# Patient Record
Sex: Male | Born: 1970 | Race: White | Hispanic: No | Smoking: Current every day smoker
Health system: Southern US, Community
[De-identification: ages and names within clinical notes are randomized; demographics above are authoritative.]

## PROBLEM LIST (undated history)

## (undated) DIAGNOSIS — Z765 Malingerer [conscious simulation]: Secondary | ICD-10-CM

## (undated) DIAGNOSIS — I10 Essential (primary) hypertension: Secondary | ICD-10-CM

## (undated) DIAGNOSIS — I428 Other cardiomyopathies: Secondary | ICD-10-CM

## (undated) DIAGNOSIS — F1010 Alcohol abuse, uncomplicated: Secondary | ICD-10-CM

## (undated) DIAGNOSIS — N200 Calculus of kidney: Secondary | ICD-10-CM

## (undated) DIAGNOSIS — G8929 Other chronic pain: Secondary | ICD-10-CM

## (undated) HISTORY — PX: CARDIAC DEFIBRILLATOR PLACEMENT: SHX171

## (undated) HISTORY — DX: Other cardiomyopathies: I42.8

## (undated) HISTORY — PX: HIP SURGERY: SHX245

## (undated) HISTORY — PX: PACEMAKER REVISION: SHX5999

## (undated) HISTORY — PX: CARDIAC PACEMAKER PLACEMENT: SHX583

## (undated) HISTORY — PX: SHOULDER SURGERY: SHX246

---

## 1999-12-07 ENCOUNTER — Emergency Department (HOSPITAL_COMMUNITY): Admission: EM | Admit: 1999-12-07 | Discharge: 1999-12-07 | Payer: Self-pay | Admitting: Emergency Medicine

## 2000-06-24 ENCOUNTER — Emergency Department (HOSPITAL_COMMUNITY): Admission: EM | Admit: 2000-06-24 | Discharge: 2000-06-24 | Payer: Self-pay | Admitting: Emergency Medicine

## 2000-06-24 ENCOUNTER — Encounter: Payer: Self-pay | Admitting: Emergency Medicine

## 2004-05-17 HISTORY — PX: OTHER SURGICAL HISTORY: SHX169

## 2005-05-17 HISTORY — PX: OTHER SURGICAL HISTORY: SHX169

## 2010-08-06 ENCOUNTER — Emergency Department (HOSPITAL_COMMUNITY)
Admission: EM | Admit: 2010-08-06 | Discharge: 2010-08-07 | Disposition: A | Discharge status: Home / Self Care | Payer: Medicare Other | Attending: Emergency Medicine | Admitting: Emergency Medicine

## 2010-08-06 DIAGNOSIS — F1210 Cannabis abuse, uncomplicated: Secondary | ICD-10-CM | POA: Insufficient documentation

## 2010-08-06 DIAGNOSIS — G8929 Other chronic pain: Secondary | ICD-10-CM | POA: Insufficient documentation

## 2010-08-06 DIAGNOSIS — F1010 Alcohol abuse, uncomplicated: Secondary | ICD-10-CM | POA: Insufficient documentation

## 2010-08-07 LAB — DIFFERENTIAL
Basophils Absolute: 0.1 10*3/uL (ref 0.0–0.1)
Basophils Relative: 1 % (ref 0–1)
Eosinophils Absolute: 0.5 10*3/uL (ref 0.0–0.7)
Eosinophils Relative: 4 % (ref 0–5)
Lymphocytes Relative: 18 % (ref 12–46)
Lymphs Abs: 2.2 10*3/uL (ref 0.7–4.0)
Monocytes Absolute: 0.7 10*3/uL (ref 0.1–1.0)
Monocytes Relative: 6 % (ref 3–12)
Neutro Abs: 8.8 10*3/uL — ABNORMAL HIGH (ref 1.7–7.7)
Neutrophils Relative %: 72 % (ref 43–77)

## 2010-08-07 LAB — CBC
HCT: 42.6 % (ref 39.0–52.0)
Hemoglobin: 13.9 g/dL (ref 13.0–17.0)
MCH: 29.6 pg (ref 26.0–34.0)
MCHC: 32.6 g/dL (ref 30.0–36.0)
MCV: 90.8 fL (ref 78.0–100.0)
Platelets: 325 10*3/uL (ref 150–400)
RBC: 4.69 MIL/uL (ref 4.22–5.81)
RDW: 12.9 % (ref 11.5–15.5)
WBC: 12.2 10*3/uL — ABNORMAL HIGH (ref 4.0–10.5)

## 2010-08-07 LAB — RAPID URINE DRUG SCREEN, HOSP PERFORMED
Amphetamines: NOT DETECTED
Barbiturates: NOT DETECTED
Benzodiazepines: POSITIVE — AB
Cocaine: NOT DETECTED
Opiates: NOT DETECTED
Tetrahydrocannabinol: POSITIVE — AB

## 2010-08-07 LAB — URINALYSIS, ROUTINE W REFLEX MICROSCOPIC
Bilirubin Urine: NEGATIVE
Glucose, UA: NEGATIVE mg/dL
Hgb urine dipstick: NEGATIVE
Ketones, ur: NEGATIVE mg/dL
Nitrite: NEGATIVE
Protein, ur: NEGATIVE mg/dL
Specific Gravity, Urine: 1.023 (ref 1.005–1.030)
Urobilinogen, UA: 0.2 mg/dL (ref 0.0–1.0)
pH: 5.5 (ref 5.0–8.0)

## 2010-08-07 LAB — BASIC METABOLIC PANEL
GFR calc Af Amer: >60 mL/min (ref >60)
GFR calc non Af Amer: >60 mL/min (ref >60)
Potassium: 3.9 mEq/L (ref 3.5–5.1)
Sodium: 139 mEq/L (ref 135–145)

## 2010-08-07 LAB — BASIC METABOLIC PANEL WITH GFR
BUN: 10 mg/dL (ref 6–23)
CO2: 28 meq/L (ref 19–32)
Calcium: 9.5 mg/dL (ref 8.4–10.5)
Chloride: 105 meq/L (ref 96–112)
Creatinine, Ser: 1.03 mg/dL (ref 0.4–1.5)
Glucose, Bld: 93 mg/dL (ref 70–99)

## 2010-08-07 LAB — ETHANOL: Alcohol, Ethyl (B): 6 mg/dL (ref 0–10)

## 2010-08-13 ENCOUNTER — Ambulatory Visit (INDEPENDENT_AMBULATORY_CARE_PROVIDER_SITE_OTHER): Payer: Medicare Other | Admitting: Family Medicine

## 2010-08-13 ENCOUNTER — Telehealth: Payer: Self-pay | Admitting: Family Medicine

## 2010-08-13 ENCOUNTER — Encounter: Payer: Self-pay | Admitting: Family Medicine

## 2010-08-13 ENCOUNTER — Ambulatory Visit
Admission: RE | Admit: 2010-08-13 | Discharge: 2010-08-13 | Disposition: A | Discharge status: Home / Self Care | Payer: Medicare Other | Source: Ambulatory Visit | Attending: Family Medicine | Admitting: Family Medicine

## 2010-08-13 DIAGNOSIS — Z95810 Presence of automatic (implantable) cardiac defibrillator: Secondary | ICD-10-CM

## 2010-08-13 DIAGNOSIS — M25579 Pain in unspecified ankle and joints of unspecified foot: Secondary | ICD-10-CM | POA: Insufficient documentation

## 2010-08-13 DIAGNOSIS — S82891A Other fracture of right lower leg, initial encounter for closed fracture: Secondary | ICD-10-CM

## 2010-08-13 DIAGNOSIS — I10 Essential (primary) hypertension: Secondary | ICD-10-CM

## 2010-08-13 DIAGNOSIS — E785 Hyperlipidemia, unspecified: Secondary | ICD-10-CM

## 2010-08-13 DIAGNOSIS — Z950 Presence of cardiac pacemaker: Secondary | ICD-10-CM

## 2010-08-13 DIAGNOSIS — S82899A Other fracture of unspecified lower leg, initial encounter for closed fracture: Secondary | ICD-10-CM

## 2010-08-13 DIAGNOSIS — F419 Anxiety disorder, unspecified: Secondary | ICD-10-CM

## 2010-08-13 DIAGNOSIS — I429 Cardiomyopathy, unspecified: Secondary | ICD-10-CM | POA: Insufficient documentation

## 2010-08-13 MED ORDER — CLONAZEPAM 1 MG PO TABS: 1.0000 mg | ORAL_TABLET | Freq: Two times a day (BID) | ORAL | Status: AC | PRN

## 2010-08-13 MED ORDER — OXYCODONE-ACETAMINOPHEN 5-325 MG PO TABS: 1.0000 | ORAL_TABLET | ORAL | Status: AC | PRN

## 2010-08-13 NOTE — Telephone Encounter (Signed)
Call pt: Does have an avulsion fracture so really does need to see ortho. I will schedule him downstairs with ortho.

## 2010-08-13 NOTE — Assessment & Plan Note (Signed)
BP is at goal. Continue current regimen. Due for labs CMP and lipids in sept.

## 2010-08-13 NOTE — Assessment & Plan Note (Signed)
Xray today to eval fracture. Continue wearing boot until we get the results. I did refill his hydrocodone for 30 more tabs. iF STILL needing pain meds after that will need to see ortho.

## 2010-08-13 NOTE — Progress Notes (Signed)
  Subjective:    Patient ID: Terry Mcconnell, male    DOB: 1971-05-14, 40 y.o.   MRN: [id number]  HPI Was walking and rolled right ankle and on March 16th.  Fracture distal left tibia. Has been wearing a boot. Due for repeat xray.  No chronic steroids.  Does eat some dairy but not a lot. He is overweight.    Still really sore to walk on.  Most of the pain oon thie ouside of his foot and on op of the foot,  Use to go to the [place].  He says he gets labs every 6 months and just had labs in march.  Has not seen ortho.    Review of Systems  Constitutional: Negative for fever, diaphoresis and unexpected weight change.  HENT: Negative for hearing loss, rhinorrhea, sneezing and tinnitus.   Eyes: Negative for visual disturbance.  Respiratory: Negative for cough and wheezing.   Cardiovascular: Negative for chest pain and palpitations.  Gastrointestinal: Negative for nausea, vomiting, diarrhea and blood in stool.  Genitourinary: Negative for dysuria and discharge.  Musculoskeletal: Negative for myalgias and arthralgias.  Skin: Negative for rash.  Neurological: Negative for headaches.  Hematological: Negative for adenopathy.  Psychiatric/Behavioral: Negative for sleep disturbance and dysphoric mood. The patient is not nervous/anxious.        Objective:   Physical Exam  Constitutional: He appears well-developed and well-nourished.  HENT:  Head: Normocephalic and atraumatic.  Cardiovascular: Normal rate, regular rhythm and normal heart sounds.        Intact distal pulses on the right.   Pulmonary/Chest: Effort normal and breath sounds normal.  Musculoskeletal:       Slightly dec ROM over the ankle.  Sig swelling on the outside of the ankle over the lateral malleolus.  stength 5/5 with flexion/extension, inversion, eversion.  No bruising.   Skin:       The left lower leg has evidence of a well healed skin graft. No active drainage.      BP 135/81  Pulse 69  Ht 5\' 8"  (1.727 m)  Wt 252 lb (114.306  kg)  BMI 38.32 kg/m2        Assessment & Plan:

## 2010-08-13 NOTE — Patient Instructions (Addendum)
I did refill your oxycodone/APAP 5/325 for 30 tabs.  Need to wean off of this for your ankle pain over the next couple of weeks We will call you with the xray results.   Keep wearing your boot until you hear from [place].

## 2010-08-14 NOTE — Telephone Encounter (Signed)
Pt notifed; if possible pt wants to be scheduled after 9 am

## 2010-08-25 ENCOUNTER — Ambulatory Visit: Payer: Medicare Other | Admitting: Family Medicine

## 2010-09-09 ENCOUNTER — Telehealth: Payer: Self-pay | Admitting: *Deleted

## 2010-09-09 DIAGNOSIS — M25579 Pain in unspecified ankle and joints of unspecified foot: Secondary | ICD-10-CM

## 2010-09-09 NOTE — Telephone Encounter (Signed)
Pt called and states he wanted a referral to [facility]

## 2010-09-09 NOTE — Telephone Encounter (Signed)
REferral entered.

## 2010-09-10 ENCOUNTER — Ambulatory Visit (INDEPENDENT_AMBULATORY_CARE_PROVIDER_SITE_OTHER): Payer: Medicare Other | Admitting: Family Medicine

## 2010-09-10 ENCOUNTER — Encounter: Payer: Self-pay | Admitting: Family Medicine

## 2010-09-10 DIAGNOSIS — M25579 Pain in unspecified ankle and joints of unspecified foot: Secondary | ICD-10-CM

## 2010-09-10 DIAGNOSIS — L821 Other seborrheic keratosis: Secondary | ICD-10-CM | POA: Insufficient documentation

## 2010-09-10 DIAGNOSIS — J309 Allergic rhinitis, unspecified: Secondary | ICD-10-CM

## 2010-09-10 MED ORDER — HYDROCODONE-ACETAMINOPHEN 7.5-500 MG PO TABS: 1.0000 | ORAL_TABLET | Freq: Four times a day (QID) | ORAL | Status: AC | PRN

## 2010-09-10 MED ORDER — ESCITALOPRAM OXALATE 20 MG PO TABS: 20.0000 mg | ORAL_TABLET | Freq: Every day | ORAL | Status: DC

## 2010-09-10 MED ORDER — BUPROPION HCL ER (XL) 150 MG PO TB24: 150.0000 mg | ORAL_TABLET | Freq: Every day | ORAL | Status: DC

## 2010-09-10 MED ORDER — KETOCONAZOLE 2 % EX SHAM: MEDICATED_SHAMPOO | CUTANEOUS | Status: AC

## 2010-09-10 NOTE — Patient Instructions (Signed)
Try to schedule an appointment with orthopedics for the next 14 days. Let me know if your scalp is not better in 3-4 weeks.

## 2010-09-10 NOTE — Progress Notes (Signed)
  Subjective:    Patient ID: Terry Mcconnell, male    DOB: 05-Sep-1970, 40 y.o.   MRN: [id number]  HPI Says he is doing well. We made his pain referral for yesterday. He needs refills his lexapro, wellbutrin and klonopin. He was in jail for 2 weeks for money he owed so that is why he missed his appt.    Scalp and beard is red, dry, and ithcy.  Has been using several types of dandruff shampoos and they are not helping   He has season allergies and he is still congested.  He had a rx product for nasal allergies but it caused nose bleeds.   He was taking oxycodone for his leg.  He did see ortho who recommended.  He would like ot have 14 days worth until he sees ortho again.  He felt that Dr. due to didn't really evaluate his leg. He plans on seeing an orthopedist. He says he would like that she stepped down to Vicodin 7.5 mg. He said he did notice a little bit itching in the creases of his forearms, when he took this previously but he is not sure if this is a true allergic reaction.    Review of Systems     Objective:   Physical Exam  Constitutional: He appears well-developed and well-nourished.  HENT:  Head: Normocephalic and atraumatic.  Right Ear: External ear normal.  Left Ear: External ear normal.  Nose: Nose normal.  Mouth/Throat: Oropharynx is clear and moist.  Eyes: Conjunctivae and EOM are normal. Pupils are equal, round, and reactive to light.  Neck: Neck supple. No thyromegaly present.  Cardiovascular: Normal rate, regular rhythm and normal heart sounds.   Pulmonary/Chest: Effort normal and breath sounds normal.  Lymphadenopathy:    He has no cervical adenopathy.  Skin: Skin is warm and dry.       Erythematous dry scaly scalp. He also has some similar rash underneath his beard.  Psychiatric: He has a normal mood and affect.          Assessment & Plan:  Allergic rhinitis-he can certainly try Allegra or Zyrtec. This might work a little but better than the Claritin. We can  consider a different nasal steroid or a nasal antihistamine if his symptoms persist.  Seborrheic dermatitis-will treat with antifungal ketoconazole shampoo. He has not had significant improvement in 3-4 weeks can consider topical steroid treatment.  Right ankle pain chronic-I did give him 14 days' worth of his medication. It is up to him that a followup appointment with the orthopedist. We have set in motion a referral for pain management.

## 2010-09-15 ENCOUNTER — Telehealth: Payer: Self-pay | Admitting: Family Medicine

## 2010-09-15 ENCOUNTER — Telehealth: Payer: Self-pay | Admitting: *Deleted

## 2010-09-15 NOTE — Telephone Encounter (Signed)
Phone note previously started therefore created in error.

## 2010-09-15 NOTE — Telephone Encounter (Addendum)
Please check with Terry Mcconnell on the Pain referal. The referral was entered the day before his Office Visit.  Also I need a reason for his GI referral. I didn't see anything in the last note and since he is under 50 and I need a reason

## 2010-09-15 NOTE — Telephone Encounter (Signed)
Pt called and wanted to know if referral had been put in for [facility] because he called and they did not know anything about it and also wants a referral to a GI doc

## 2010-09-15 NOTE — Telephone Encounter (Signed)
Called pt and he went to [facility] each time he had "stomach" issues. Advised pt that he needs appt to discuss stomach issues

## 2010-09-16 ENCOUNTER — Other Ambulatory Visit: Payer: Self-pay | Admitting: Family Medicine

## 2010-09-16 MED ORDER — CLONAZEPAM 0.5 MG PO TABS: 0.5000 mg | ORAL_TABLET | Freq: Two times a day (BID) | ORAL | Status: DC | PRN

## 2010-09-17 ENCOUNTER — Telehealth: Payer: Self-pay | Admitting: Family Medicine

## 2010-09-17 NOTE — Telephone Encounter (Signed)
Patient called and left a mess. on our voice mail stating that Clonazepam had been changed from 1mg  to .5 mg and he would like to know why and is very upset about this. Patient called at 2:35 in the morning to leave a message about this. Please advise him of why this change has happened.

## 2010-09-17 NOTE — Telephone Encounter (Signed)
Call pt: Can you change this to to 1mg . Dose. I am not sure. I just refilled what was in there.

## 2010-09-17 NOTE — Telephone Encounter (Signed)
Jay pharmacist at [facility] notified to change pts Clonazepam RX to 1mg  instead of .5mg .  Sent wrong dose in error.  Told pharmacist to straighten out since pt already picked up script .5mg  # 60.  Had one refill on script, therefore gave verbal for 1mg  #60/1rf for the next time pt will need to get refill.  Pt informed and an apology was given Jarvis Newcomer, LPN [facility]

## 2010-09-29 ENCOUNTER — Ambulatory Visit
Admission: RE | Admit: 2010-09-29 | Discharge: 2010-09-29 | Disposition: A | Discharge status: Home / Self Care | Payer: Medicare Other | Source: Ambulatory Visit | Attending: Family Medicine | Admitting: Family Medicine

## 2010-09-29 ENCOUNTER — Encounter: Payer: Self-pay | Admitting: Family Medicine

## 2010-09-29 ENCOUNTER — Ambulatory Visit (INDEPENDENT_AMBULATORY_CARE_PROVIDER_SITE_OTHER): Payer: Medicare Other | Admitting: Family Medicine

## 2010-09-29 DIAGNOSIS — S82891A Other fracture of right lower leg, initial encounter for closed fracture: Secondary | ICD-10-CM

## 2010-09-29 DIAGNOSIS — M25579 Pain in unspecified ankle and joints of unspecified foot: Secondary | ICD-10-CM

## 2010-09-29 DIAGNOSIS — G8929 Other chronic pain: Secondary | ICD-10-CM

## 2010-09-29 DIAGNOSIS — R1110 Vomiting, unspecified: Secondary | ICD-10-CM

## 2010-09-29 NOTE — Progress Notes (Signed)
Subjective:    Patient ID: Terry Mcconnell, male    DOB: 1970-12-29, 40 y.o.   MRN: [id number]  HPI Was seen at [facility] for vomiting and then hydrated and sent home.  Then started getting nauseated again took nausea pills. Went  To to ED again and kept overnight for 3 days  He said he had e coli. Then  Went back a few days and told it may be his gallbladder. They did an [place] and Abdominal CT in early March (6th-13th). D/C home and did well for about 1.5 then started vomiting again. He was told the GB was swollen but no stones. He is doing a bone density test today.  He prefers [facility] for GI.  He still has a couple of zofran left at home. He did note he takes a PPI regularly. I did update his medication list.  He would also like a refill on his pain medication. He saw the orthopedist again since I last saw him. They did not give him a prescription for pain medication. He said he was unable to get in at the office we initially made the pain referral. He was denied. In the past he had seen Dr. [name] Likes the approximately 2-3 years ago. Around that time he decided he wanted to come off of his medications and so discontinued them and never return to Dr. [name] Likes his office. He says he never actually broke a pain contract and was on a good relationship with him.   BP 129/72  Pulse 69  Ht 5\' 8"  (1.727 m)  Wt 248 lb (112.492 kg)  BMI 37.71 kg/m2    Allergies  Allergen Reactions  . Vicodin (Hydrocodone-Acetaminophen)     Past Medical History  Diagnosis Date  . Left ventricular noncompaction cardiomyopathy     Past Surgical History  Procedure Date  . Left knee surgery 2007  . Skin grafts 2006    x 2   . Cardiac defibrillator placement     x 2   . Cardiac pacemaker placement     x 2     History   Social History  . Marital Status: Married    Spouse Name: Jill Side    Number of Children: no  . Years of Education: N/A   Occupational History  . veteran    Social History Main Topics  . Smoking  status: Former Smoker    Types: Cigarettes  . Smokeless tobacco: Former Neurosurgeon    Types: Snuff    Quit date: 06/15/2003  . Alcohol Use: Yes  . Drug Use: No  . Sexually Active: Yes   Other Topics Concern  . Not on file   Social History Narrative   No regular exercise.     Family History  Problem Relation Age of Onset  . Bipolar disorder Brother   . Diabetes type II Father     Current outpatient prescriptions:buPROPion (WELLBUTRIN XL) 150 MG 24 hr tablet, Take 1 tablet (150 mg total) by mouth daily., Disp: 30 tablet, Rfl: 4;  carvedilol (COREG) 25 MG tablet, Take 25 mg by mouth 2 (two) times daily with a meal.  , Disp: , Rfl: ;  clonazePAM (KLONOPIN) 1 MG tablet, Take 1 mg by mouth 2 (two) times daily as needed.  , Disp: , Rfl:  escitalopram (LEXAPRO) 20 MG tablet, Take 1 tablet (20 mg total) by mouth daily., Disp: 30 tablet, Rfl: 4;  ketoconazole (NIZORAL) 2 % shampoo, Apply topically 2 (two) times a week. Apply to scalp and  leave on for 5 minutes twice a week., Disp: 240 mL, Rfl: 1;  lisinopril (PRINIVIL,ZESTRIL) 5 MG tablet, Take 5 mg by mouth daily.  , Disp: , Rfl: ;  Multiple Vitamin (MULTIVITAMIN) tablet, Take 1 tablet by mouth daily.  , Disp: , Rfl:  nitroGLYCERIN (NITROSTAT) 0.4 MG SL tablet, Place 0.4 mg under the tongue every 5 (five) minutes as needed.  , Disp: , Rfl: ;  pantoprazole (PROTONIX) 40 MG tablet, Take 40 mg by mouth daily.  , Disp: , Rfl: ;  Probiotic Product (PROBIOTIC ACIDOPHILUS PO), Take by mouth.  , Disp: , Rfl: ;  simvastatin (ZOCOR) 20 MG tablet, Take 20 mg by mouth at bedtime.  , Disp: , Rfl:  DISCONTD: clonazePAM (KLONOPIN) 0.5 MG tablet, Take 1 tablet (0.5 mg total) by mouth 2 (two) times daily as needed., Disp: 60 tablet, Rfl: 1;  DISCONTD: clonazePAM (KLONOPIN) 0.5 MG tablet, Take 1 mg by mouth 2 (two) times daily as needed. , Disp: , Rfl:   Review of Systems     Objective:   Physical Exam  Constitutional: He appears well-developed and well-nourished.    HENT:  Head: Normocephalic and atraumatic.          Assessment & Plan:  Abdominal pain and vomiting-he is asymptomatic today. I did not do an exam. At this point, need to get records and we can refer him to GI. It sounds like they were unclear about what exactly was causing his vomiting. Certainly it sounded like the gallbladder may have been suspicious. Typically with cholecystitis I recommend referral to a general surgeon but this may not be his exact diagnosis. We will schedule him at [facility]. He has Zofran at home he can use when necessary and he can call our office if he needs a refill.

## 2010-09-29 NOTE — Patient Instructions (Signed)
We will call you with the GI referral and for Dr. [name] Likes.

## 2010-09-29 NOTE — Assessment & Plan Note (Signed)
For his chronic pain I recommend that we try to schedule him with Dr. [name] Likes. The left that clinic with a good relationship and did not break a pain contract I think this would be a great place to start. Plus, they should have all records on him. I discussed with him that I would not refill his pain medications and that if his orthopedist feels that he needs medications until his appointment with pain management that they can fill these for him.

## 2010-10-01 ENCOUNTER — Telehealth: Payer: Self-pay | Admitting: Family Medicine

## 2010-10-01 NOTE — Telephone Encounter (Signed)
Left message on pt vm. 

## 2010-10-01 NOTE — Telephone Encounter (Signed)
Call pt: Normal bone density

## 2010-12-04 ENCOUNTER — Telehealth: Payer: Self-pay | Admitting: Family Medicine

## 2010-12-04 NOTE — Telephone Encounter (Signed)
Pt requesting refill of allegra.  When I look in pt chart and on med list there isn't a allergy med prescribed.  Please advise as I know a lot of the allergy meds are OTC. Terry Newcomer, LPN [facility]

## 2010-12-06 MED ORDER — FEXOFENADINE HCL 180 MG PO TABS: 180.0000 mg | ORAL_TABLET | Freq: Every day | ORAL | Status: DC

## 2010-12-06 NOTE — Telephone Encounter (Signed)
Will send new rx but his insurance may not cover it since it is now OTC.

## 2010-12-07 ENCOUNTER — Other Ambulatory Visit: Payer: Self-pay | Admitting: *Deleted

## 2010-12-07 MED ORDER — CLONAZEPAM 1 MG PO TABS: 1.0000 mg | ORAL_TABLET | Freq: Two times a day (BID) | ORAL | Status: DC | PRN

## 2010-12-17 ENCOUNTER — Ambulatory Visit: Payer: Medicare Other | Admitting: Family Medicine

## 2010-12-17 DIAGNOSIS — Z0289 Encounter for other administrative examinations: Secondary | ICD-10-CM

## 2010-12-22 ENCOUNTER — Ambulatory Visit: Payer: Medicare Other | Admitting: Family Medicine

## 2010-12-22 DIAGNOSIS — Z0289 Encounter for other administrative examinations: Secondary | ICD-10-CM

## 2011-01-12 ENCOUNTER — Other Ambulatory Visit: Payer: Self-pay | Admitting: *Deleted

## 2011-01-12 MED ORDER — CLONAZEPAM 1 MG PO TABS: 1.0000 mg | ORAL_TABLET | Freq: Two times a day (BID) | ORAL | Status: DC | PRN

## 2011-01-22 ENCOUNTER — Encounter: Payer: Self-pay | Admitting: Family Medicine

## 2011-01-22 ENCOUNTER — Ambulatory Visit (INDEPENDENT_AMBULATORY_CARE_PROVIDER_SITE_OTHER): Payer: Medicare Other | Admitting: Family Medicine

## 2011-01-22 VITALS — BP 133/87 | HR 87 | Wt 235.0 lb

## 2011-01-22 DIAGNOSIS — F411 Generalized anxiety disorder: Secondary | ICD-10-CM

## 2011-01-22 DIAGNOSIS — F419 Anxiety disorder, unspecified: Secondary | ICD-10-CM

## 2011-01-22 DIAGNOSIS — Z23 Encounter for immunization: Secondary | ICD-10-CM

## 2011-01-22 DIAGNOSIS — F329 Major depressive disorder, single episode, unspecified: Secondary | ICD-10-CM

## 2011-01-22 MED ORDER — FLUOXETINE HCL 20 MG PO TABS: ORAL_TABLET | ORAL | Status: DC

## 2011-01-22 MED ORDER — DIAZEPAM 2 MG PO TABS: 2.0000 mg | ORAL_TABLET | Freq: Two times a day (BID) | ORAL | Status: AC | PRN

## 2011-01-22 MED ORDER — OLANZAPINE 5 MG PO TBDP: 5.0000 mg | ORAL_TABLET | Freq: Every day | ORAL | Status: AC

## 2011-01-22 NOTE — Patient Instructions (Signed)
Decrease the lexapro to 1/2 tab daily for one week, then decrease to 1/2 every other day for 10 days then stop. Start fluoxetine in 7 days with 1/2 tab daily for one week and then increase to whole tab.  Welbutrin every other day for 2 weeks, then stop.  Can start the olanzapine 5 mg daily. Cn increase to 10mg  after one week if needed.

## 2011-01-22 NOTE — Progress Notes (Signed)
  Subjective:    Patient ID: Terry Mcconnell, male    DOB: 07-09-1970, 40 y.o.   MRN: [id number]  HPI Last few months has been getting oeverwhelmed and feels his anxiety is not well controlled. He is on lexapro and wellbutin and the klonopin. Not sleeping well at night.  Says will hear a noise and get very anxious. Has been more irritable.  Will start get heart racing and sweating.  Has called the [place] and has an appt on the 28th.  He was on symbyax at one time and says it worked really well. Unfortunately he only took it for a short period time because his insurance would no longer cover it. Sister has Bipolar d/o. He also feels down and depressed,  his wife is very supportive of him.   Review of Systems     Objective:   Physical Exam  Constitutional: He is oriented to person, place, and time. He appears well-developed and well-nourished.  Cardiovascular: Normal heart sounds.   Neurological: He is alert and oriented to person, place, and time.  Psychiatric: He has a normal mood and affect. His behavior is normal. Thought content normal.          Assessment & Plan:  Anxiety-GAD- 7 score of 19 today.  Clearly his anxiety is not well controlled. I do feel he also has an element of depression. Also listened to his history I think he actually could be bipolar. He made the comment that at one point when he was put on Prozac by itself he actually felt hyper. He also has a family history of bipolar disorder. Since he had tried Symbyax, I will go ahead and start him on fluoxetine and Risperdal. I wrote down a wean off of the Lexapro and Wellbutrin. He also specifically requested that I could change him to Valium instead of lorazepam. I also changed him to 2 mg twice a day. I'll see him back in one month. I did encourage him to keep his appointment at the [facility] as well so that they can further diagnosis. 25 minutes spent face-to-face in counseling and discussing therapy options.

## 2011-02-09 ENCOUNTER — Other Ambulatory Visit: Payer: Self-pay | Admitting: Family Medicine

## 2011-02-19 ENCOUNTER — Other Ambulatory Visit: Payer: Self-pay | Admitting: *Deleted

## 2011-02-19 MED ORDER — DIAZEPAM 2 MG PO TABS: 2.0000 mg | ORAL_TABLET | Freq: Two times a day (BID) | ORAL | Status: DC | PRN

## 2011-03-30 ENCOUNTER — Ambulatory Visit: Payer: Medicare Other | Admitting: Family Medicine

## 2011-03-30 DIAGNOSIS — Z0289 Encounter for other administrative examinations: Secondary | ICD-10-CM

## 2011-04-02 ENCOUNTER — Other Ambulatory Visit: Payer: Self-pay | Admitting: Family Medicine

## 2011-04-05 ENCOUNTER — Other Ambulatory Visit: Payer: Self-pay | Admitting: *Deleted

## 2011-04-05 MED ORDER — DIAZEPAM 2 MG PO TABS: 2.0000 mg | ORAL_TABLET | Freq: Two times a day (BID) | ORAL | Status: DC | PRN

## 2011-04-22 ENCOUNTER — Other Ambulatory Visit: Payer: Self-pay | Admitting: *Deleted

## 2011-04-22 MED ORDER — DIAZEPAM 2 MG PO TABS: 2.0000 mg | ORAL_TABLET | Freq: Two times a day (BID) | ORAL | Status: DC | PRN

## 2013-09-21 DIAGNOSIS — I110 Hypertensive heart disease with heart failure: Secondary | ICD-10-CM | POA: Diagnosis not present

## 2013-09-21 DIAGNOSIS — E7800 Pure hypercholesterolemia, unspecified: Secondary | ICD-10-CM | POA: Diagnosis not present

## 2013-09-21 DIAGNOSIS — I501 Left ventricular failure: Secondary | ICD-10-CM | POA: Diagnosis not present

## 2013-09-21 DIAGNOSIS — I509 Heart failure, unspecified: Secondary | ICD-10-CM | POA: Diagnosis not present

## 2013-09-25 DIAGNOSIS — M67919 Unspecified disorder of synovium and tendon, unspecified shoulder: Secondary | ICD-10-CM | POA: Diagnosis not present

## 2013-10-19 DIAGNOSIS — M1610 Unilateral primary osteoarthritis, unspecified hip: Secondary | ICD-10-CM | POA: Diagnosis not present

## 2013-10-19 DIAGNOSIS — M25559 Pain in unspecified hip: Secondary | ICD-10-CM | POA: Diagnosis not present

## 2013-10-19 DIAGNOSIS — M169 Osteoarthritis of hip, unspecified: Secondary | ICD-10-CM | POA: Diagnosis not present

## 2014-04-23 DIAGNOSIS — F419 Anxiety disorder, unspecified: Secondary | ICD-10-CM | POA: Diagnosis not present

## 2014-04-23 DIAGNOSIS — M25552 Pain in left hip: Secondary | ICD-10-CM | POA: Diagnosis not present

## 2014-04-23 DIAGNOSIS — M25512 Pain in left shoulder: Secondary | ICD-10-CM | POA: Diagnosis not present

## 2014-08-01 ENCOUNTER — Emergency Department (HOSPITAL_BASED_OUTPATIENT_CLINIC_OR_DEPARTMENT_OTHER)
Admission: EM | Admit: 2014-08-01 | Discharge: 2014-08-01 | Disposition: A | Discharge status: Home / Self Care | Payer: Commercial Managed Care - HMO | Attending: Emergency Medicine | Admitting: Emergency Medicine

## 2014-08-01 ENCOUNTER — Encounter (HOSPITAL_BASED_OUTPATIENT_CLINIC_OR_DEPARTMENT_OTHER): Payer: Self-pay | Admitting: *Deleted

## 2014-08-01 DIAGNOSIS — M549 Dorsalgia, unspecified: Secondary | ICD-10-CM | POA: Diagnosis present

## 2014-08-01 DIAGNOSIS — I10 Essential (primary) hypertension: Secondary | ICD-10-CM | POA: Diagnosis not present

## 2014-08-01 DIAGNOSIS — Z79899 Other long term (current) drug therapy: Secondary | ICD-10-CM | POA: Insufficient documentation

## 2014-08-01 DIAGNOSIS — M545 Low back pain, unspecified: Secondary | ICD-10-CM

## 2014-08-01 DIAGNOSIS — Z95810 Presence of automatic (implantable) cardiac defibrillator: Secondary | ICD-10-CM | POA: Diagnosis not present

## 2014-08-01 DIAGNOSIS — Z87891 Personal history of nicotine dependence: Secondary | ICD-10-CM | POA: Diagnosis not present

## 2014-08-01 HISTORY — DX: Essential (primary) hypertension: I10

## 2014-08-01 MED ORDER — NAPROXEN 500 MG PO TABS: 500.0000 mg | ORAL_TABLET | Freq: Two times a day (BID) | ORAL | Status: DC

## 2014-08-01 MED ORDER — CYCLOBENZAPRINE HCL 10 MG PO TABS
5.0000 mg | ORAL_TABLET | Freq: Once | ORAL | Status: AC
  Administered 2014-08-01: 5 mg via ORAL
  Filled 2014-08-01: qty 1

## 2014-08-01 MED ORDER — NAPROXEN 250 MG PO TABS
500.0000 mg | ORAL_TABLET | Freq: Once | ORAL | Status: AC
  Administered 2014-08-01: 500 mg via ORAL
  Filled 2014-08-01: qty 2

## 2014-08-01 MED ORDER — CYCLOBENZAPRINE HCL 10 MG PO TABS: 10.0000 mg | ORAL_TABLET | Freq: Two times a day (BID) | ORAL | Status: DC | PRN

## 2014-08-01 NOTE — ED Provider Notes (Signed)
CSN: [id number]     Arrival date & time 08/01/14  1813 History   First MD Initiated Contact with Patient 08/01/14 2007     Chief Complaint  Patient presents with  . Leg Pain  . Back Pain     (Consider location/radiation/quality/duration/timing/severity/associated sxs/prior Treatment) HPI   44 year old male with history of back pain and left leg surgery who presents for evaluation of back pain. Patient states he usually has pain to his left leg and left knee from prior surgery and has been taking hydrocodone 7.5 mg as needed for pain. He also endorsed intermittent low back pain for the past 6 months. For the past 2 days he has had worsening low back pain which he described as a sharp throbbing sensation, nonradiating, worsening with ambulation and improves when he leaned forward. He also noticed bilateral leg weakness with the back pain. States he breaks out into sweats when the pain is severe. He denies any specific injuries to his back but states when he was seen in the [employer] he has to carry 80 pounds backpack everywhere which he thinks may have injured his back. Report having an x-ray of his low back 3 months ago by PCP that does not show any acute finding. Patient also denies having fever, dysuria, hematuria, bowel or bladder incontinence, saddle anesthesia, or rash. He did attempt to follow-up with his primary care doctor but because his doctor is so busy he was recommended that he come to the ED for further evaluation. His next follow-up appointment is March 21. No history of IV drug use or active cancer.  Past Medical History  Diagnosis Date  . Left ventricular noncompaction cardiomyopathy   . Hypertension    Past Surgical History  Procedure Laterality Date  . Left knee surgery  2007  . Skin grafts  2006    x 2   . Cardiac defibrillator placement      x 2   . Cardiac pacemaker placement      x 2    Family History  Problem Relation Age of Onset  . Bipolar disorder Brother   .  Diabetes type II Father    History  Substance Use Topics  . Smoking status: Former Smoker    Types: Cigarettes  . Smokeless tobacco: Current User    Types: Snuff    Last Attempt to Quit: 06/15/2003  . Alcohol Use: Yes    Review of Systems  Gastrointestinal: Negative for abdominal pain.  Genitourinary: Negative for dysuria.  Musculoskeletal: Negative for back pain.  Skin: Negative for rash.  Neurological: Negative for numbness.  All other systems reviewed and are negative.     Allergies  Review of patient's allergies indicates no active allergies.  Home Medications   Prior to Admission medications   Medication Sig Start Date End Date Taking? Authorizing Provider  carvedilol (COREG) 25 MG tablet Take 25 mg by mouth 2 (two) times daily with a meal.      Historical Provider, MD  diazepam (VALIUM) 2 MG tablet Take 1 tablet (2 mg total) by mouth 2 (two) times daily as needed. 04/22/11   Agapito Games, MD  fexofenadine (ALLEGRA) 180 MG tablet Take 1 tablet (180 mg total) by mouth daily. 12/06/10 12/06/11  Agapito Games, MD  FLUoxetine (PROZAC) 20 MG tablet TAKE 1/2 TABLET BY MOUTH EVERY DAY FOR 1 WEEK THEN INCREASE TO 1 TABLET DAILY 02/09/11   Agapito Games, MD  lisinopril (PRINIVIL,ZESTRIL) 5 MG tablet Take 5 mg  by mouth daily.      Historical Provider, MD  Multiple Vitamin (MULTIVITAMIN) tablet Take 1 tablet by mouth daily.      Historical Provider, MD  nitroGLYCERIN (NITROSTAT) 0.4 MG SL tablet Place 0.4 mg under the tongue every 5 (five) minutes as needed.      Historical Provider, MD  OLANZapine zydis (ZYPREXA) 5 MG disintegrating tablet TAKE 1 TABLET BY MOUTH AT BEDTIME 04/02/11   Agapito Games, MD  pantoprazole (PROTONIX) 40 MG tablet Take 40 mg by mouth daily.      Historical Provider, MD  Probiotic Product (PROBIOTIC ACIDOPHILUS PO) Take by mouth.      Historical Provider, MD  simvastatin (ZOCOR) 20 MG tablet Take 20 mg by mouth at bedtime.       Historical Provider, MD   BP 117/71 mmHg  Pulse 81  Temp(Src) 98.5 F (36.9 C) (Oral)  Resp 16  Ht  (1.727 m)  Wt 215 lb (97.523 kg)  BMI 32.70 kg/m2  SpO2 98% Physical Exam  Constitutional: He appears well-developed and well-nourished. No distress.  HENT:  Head: Atraumatic.  Eyes: Conjunctivae are normal.  Neck: Normal range of motion. Neck supple.  Abdominal: There is no tenderness.  Musculoskeletal: He exhibits tenderness (tenderness to lumbar and paralumbar spinal muscle on palpation without overlying skin changes.).  5/5 strength to bilateral lower extremities with intact patellar deep tendon reflex, no foot drop, and normal dorsiflexion of great toe  Negative straight leg raise  Neurological: He is alert.  Sensation is intact throughout lower extremities. Patient able to ambulate with a mild hunch back.  Skin: No rash noted.  Large skin graft noted to left lower anterior tibial region with no evidence of infection and nontender to palpation.  Psychiatric: He has a normal mood and affect.    ED Course  Procedures (including critical care time)  Patient here with acute on chronic back pain. No red flags. Suspect spinal stenosis as symptoms improve when patient flexes his back. Low suspicion for cauda equina or sciatica. I have reviewed [place] controlled substance database which revealed that patient has received 90 hydrocodone on March 14 and 60 alprazolam on March 11. At this time I will only provide pain medication in the ED and will discharge with muscle relaxant and close follow-up.  Labs Review Labs Reviewed - No data to display  Imaging Review No results found.   EKG Interpretation None      MDM   Final diagnoses:  Midline low back pain without sciatica    BP 117/71 mmHg  Pulse 81  Temp(Src) 98.5 F (36.9 C) (Oral)  Resp 16  Ht  (1.727 m)  Wt 215 lb (97.523 kg)  BMI 32.70 kg/m2  SpO2 98%     Fayrene Helper, PA-C 08/01/14 2051  Rolan Bucco, MD 08/02/14 0028

## 2014-08-01 NOTE — ED Notes (Signed)
Pt reports left leg pain that radiates up to pt's lower/mid back pain x2 days, denies any mechanism of injury - pt w/ hx of back pain and left leg surgery including skin grafts and knee surgery - pt attempted to see his PCP however was unable to be seen and PCP advised pt to be evaluated in ED. Pt states he has taken x2 of his prescribed 7.5mg  hyrdrocodone w/o relief.

## 2014-08-01 NOTE — ED Notes (Signed)
PA at bedside.

## 2014-08-01 NOTE — Discharge Instructions (Signed)
Back Exercises These exercises may help you when beginning to rehabilitate your injury. Your symptoms may resolve with or without further involvement from your physician, physical therapist or athletic trainer. While completing these exercises, remember:   Restoring tissue flexibility helps normal motion to return to the joints. This allows healthier, less painful movement and activity.  An effective stretch should be held for at least 30 seconds.  A stretch should never be painful. You should only feel a gentle lengthening or release in the stretched tissue. STRETCH - Extension, Prone on Elbows   Lie on your stomach on the floor, a bed will be too soft. Place your palms about shoulder width apart and at the height of your head.  Place your elbows under your shoulders. If this is too painful, stack pillows under your chest.  Allow your body to relax so that your hips drop lower and make contact more completely with the floor.  Hold this position for __________ seconds.  Slowly return to lying flat on the floor. Repeat __________ times. Complete this exercise __________ times per day.  RANGE OF MOTION - Extension, Prone Press Ups   Lie on your stomach on the floor, a bed will be too soft. Place your palms about shoulder width apart and at the height of your head.  Keeping your back as relaxed as possible, slowly straighten your elbows while keeping your hips on the floor. You may adjust the placement of your hands to maximize your comfort. As you gain motion, your hands will come more underneath your shoulders.  Hold this position __________ seconds.  Slowly return to lying flat on the floor. Repeat __________ times. Complete this exercise __________ times per day.  RANGE OF MOTION- Quadruped, Neutral Spine   Assume a hands and knees position on a firm surface. Keep your hands under your shoulders and your knees under your hips. You may place padding under your knees for  comfort.  Drop your head and point your tail bone toward the ground below you. This will round out your low back like an angry cat. Hold this position for __________ seconds.  Slowly lift your head and release your tail bone so that your back sags into a large arch, like an old horse.  Hold this position for __________ seconds.  Repeat this until you feel limber in your low back.  Now, find your "sweet spot." This will be the most comfortable position somewhere between the two previous positions. This is your neutral spine. Once you have found this position, tense your stomach muscles to support your low back.  Hold this position for __________ seconds. Repeat __________ times. Complete this exercise __________ times per day.  STRETCH - Flexion, Single Knee to Chest   Lie on a firm bed or floor with both legs extended in front of you.  Keeping one leg in contact with the floor, bring your opposite knee to your chest. Hold your leg in place by either grabbing behind your thigh or at your knee.  Pull until you feel a gentle stretch in your low back. Hold __________ seconds.  Slowly release your grasp and repeat the exercise with the opposite side. Repeat __________ times. Complete this exercise __________ times per day.  STRETCH - Hamstrings, Standing  Stand or sit and extend your right / left leg, placing your foot on a chair or foot stool  Keeping a slight arch in your low back and your hips straight forward.  Lead with your chest and   lean forward at the waist until you feel a gentle stretch in the back of your right / left knee or thigh. (When done correctly, this exercise requires leaning only a small distance.)  Hold this position for __________ seconds. Repeat __________ times. Complete this stretch __________ times per day. STRENGTHENING - Deep Abdominals, Pelvic Tilt   Lie on a firm bed or floor. Keeping your legs in front of you, bend your knees so they are both pointed  toward the ceiling and your feet are flat on the floor.  Tense your lower abdominal muscles to press your low back into the floor. This motion will rotate your pelvis so that your tail bone is scooping upwards rather than pointing at your feet or into the floor.  With a gentle tension and even breathing, hold this position for __________ seconds. Repeat __________ times. Complete this exercise __________ times per day.  STRENGTHENING - Abdominals, Crunches   Lie on a firm bed or floor. Keeping your legs in front of you, bend your knees so they are both pointed toward the ceiling and your feet are flat on the floor. Cross your arms over your chest.  Slightly tip your chin down without bending your neck.  Tense your abdominals and slowly lift your trunk high enough to just clear your shoulder blades. Lifting higher can put excessive stress on the low back and does not further strengthen your abdominal muscles.  Control your return to the starting position. Repeat __________ times. Complete this exercise __________ times per day.  STRENGTHENING - Quadruped, Opposite UE/LE Lift   Assume a hands and knees position on a firm surface. Keep your hands under your shoulders and your knees under your hips. You may place padding under your knees for comfort.  Find your neutral spine and gently tense your abdominal muscles so that you can maintain this position. Your shoulders and hips should form a rectangle that is parallel with the floor and is not twisted.  Keeping your trunk steady, lift your right hand no higher than your shoulder and then your left leg no higher than your hip. Make sure you are not holding your breath. Hold this position __________ seconds.  Continuing to keep your abdominal muscles tense and your back steady, slowly return to your starting position. Repeat with the opposite arm and leg. Repeat __________ times. Complete this exercise __________ times per day. Document Released:  05/21/2005 Document Revised: 07/26/2011 Document Reviewed: 08/15/2008 [employer] Patient Information 2015 [employer]. This information is not intended to replace advice given to you by your health care provider. Make sure you discuss any questions you have with your health care provider.  

## 2015-01-16 DIAGNOSIS — R079 Chest pain, unspecified: Secondary | ICD-10-CM | POA: Diagnosis not present

## 2015-01-16 DIAGNOSIS — T849XXA Unspecified complication of internal orthopedic prosthetic device, implant and graft, initial encounter: Secondary | ICD-10-CM | POA: Diagnosis not present

## 2015-01-16 DIAGNOSIS — T82599A Other mechanical complication of unspecified cardiac and vascular devices and implants, initial encounter: Secondary | ICD-10-CM | POA: Diagnosis not present

## 2017-01-31 ENCOUNTER — Emergency Department (HOSPITAL_BASED_OUTPATIENT_CLINIC_OR_DEPARTMENT_OTHER): Payer: Medicare Other

## 2017-01-31 ENCOUNTER — Encounter (HOSPITAL_BASED_OUTPATIENT_CLINIC_OR_DEPARTMENT_OTHER): Payer: Self-pay | Admitting: *Deleted

## 2017-01-31 ENCOUNTER — Emergency Department (HOSPITAL_BASED_OUTPATIENT_CLINIC_OR_DEPARTMENT_OTHER)
Admission: EM | Admit: 2017-01-31 | Discharge: 2017-01-31 | Disposition: A | Discharge status: Home / Self Care | Payer: Medicare Other | Attending: Emergency Medicine | Admitting: Emergency Medicine

## 2017-01-31 DIAGNOSIS — Z79899 Other long term (current) drug therapy: Secondary | ICD-10-CM | POA: Diagnosis not present

## 2017-01-31 DIAGNOSIS — Z95810 Presence of automatic (implantable) cardiac defibrillator: Secondary | ICD-10-CM | POA: Insufficient documentation

## 2017-01-31 DIAGNOSIS — N201 Calculus of ureter: Secondary | ICD-10-CM | POA: Diagnosis not present

## 2017-01-31 DIAGNOSIS — I10 Essential (primary) hypertension: Secondary | ICD-10-CM | POA: Diagnosis not present

## 2017-01-31 DIAGNOSIS — F17290 Nicotine dependence, other tobacco product, uncomplicated: Secondary | ICD-10-CM | POA: Diagnosis not present

## 2017-01-31 DIAGNOSIS — R1032 Left lower quadrant pain: Secondary | ICD-10-CM | POA: Diagnosis present

## 2017-01-31 HISTORY — DX: Calculus of kidney: N20.0

## 2017-01-31 LAB — URINALYSIS, MICROSCOPIC (REFLEX): Bacteria, UA: NONE SEEN

## 2017-01-31 LAB — URINALYSIS, ROUTINE W REFLEX MICROSCOPIC
BILIRUBIN URINE: NEGATIVE
Glucose, UA: NEGATIVE mg/dL
KETONES UR: NEGATIVE mg/dL
Leukocytes, UA: NEGATIVE
NITRITE: NEGATIVE
Protein, ur: 30 mg/dL — AB
pH: 6 (ref 5.0–8.0)

## 2017-01-31 MED ORDER — HYDROMORPHONE HCL 4 MG PO TABS: 4.0000 mg | ORAL_TABLET | ORAL | 0 refills | Status: DC | PRN

## 2017-01-31 MED ORDER — TAMSULOSIN HCL 0.4 MG PO CAPS: ORAL_CAPSULE | ORAL | 0 refills | Status: DC

## 2017-01-31 MED ORDER — ONDANSETRON HCL 4 MG/2ML IJ SOLN
4.0000 mg | Freq: Once | INTRAMUSCULAR | Status: AC
  Administered 2017-01-31: 4 mg via INTRAVENOUS
  Filled 2017-01-31: qty 2

## 2017-01-31 MED ORDER — SODIUM CHLORIDE 0.9 % IV SOLN
Freq: Once | INTRAVENOUS | Status: AC
  Administered 2017-01-31: 04:00:00 via INTRAVENOUS

## 2017-01-31 MED ORDER — ONDANSETRON 8 MG PO TBDP: 8.0000 mg | ORAL_TABLET | Freq: Three times a day (TID) | ORAL | 0 refills | Status: DC | PRN

## 2017-01-31 MED ORDER — HYDROMORPHONE HCL 1 MG/ML IJ SOLN
1.0000 mg | Freq: Once | INTRAMUSCULAR | Status: AC
  Administered 2017-01-31: 1 mg via INTRAVENOUS
  Filled 2017-01-31: qty 1

## 2017-01-31 MED ORDER — TAMSULOSIN HCL 0.4 MG PO CAPS
0.4000 mg | ORAL_CAPSULE | Freq: Once | ORAL | Status: AC
  Administered 2017-01-31: 0.4 mg via ORAL
  Filled 2017-01-31: qty 1

## 2017-01-31 NOTE — ED Provider Notes (Addendum)
[facility] Provider Note: Lowella Dell, MD, FACEP  CSN: [id number] MRN: [id number] ARRIVAL: 01/31/17 at 0338 ROOM: [facility]02/[facility]   CHIEF COMPLAINT  Flank Pain   HISTORY OF PRESENT ILLNESS  01/31/17 3:45 AM Terry Mcconnell is a 46 y.o. male with a history of nephrolithiasis. He is here with left flank pain that began about an hour and a half ago. He describes the pain as severe and worse than previous kidney stone. The pain radiates to the left upper quadrant of his abdomen. It is worse with lying supine. There is been associated nausea but no vomiting. He denies dysuria or difficulty urinating but has noted his urine is darker than usual.  Consultation with the [facility] state controlled substances database reveals the patient has received 14 prescriptions for oxycodone and for prescriptions for tramadol in the past year, none since May.   Past Medical History:  Diagnosis Date  . Hypertension   . Kidney stones   . 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 CARDIAC DEFIBRILLATOR PLACEMENT     x 2   . CARDIAC PACEMAKER PLACEMENT     x 2   . left knee surgery  2007  . PACEMAKER REVISION    . skin grafts  2006   x 2     Family History  Problem Relation Age of Onset  . Bipolar disorder Brother   . Diabetes type II Father     Social History  Substance Use Topics  . Smoking status: Former Smoker    Types: Cigarettes  . Smokeless tobacco: Current User    Types: Snuff    Last attempt to quit: 06/15/2003  . Alcohol use No    Prior to Admission medications   Medication Sig Start Date End Date Taking? Authorizing Provider  carvedilol (COREG) 25 MG tablet Take 25 mg by mouth 2 (two) times daily with a meal.      [provider]  cyclobenzaprine (FLEXERIL) 10 MG tablet Take 1 tablet (10 mg total) by mouth 2 (two) times daily as needed for muscle spasms. 08/01/14   Fayrene Helper, PA-C    diazepam (VALIUM) 2 MG tablet Take 1 tablet (2 mg total) by mouth 2 (two) times daily as needed. 04/22/11   Agapito Games, MD  fexofenadine (ALLEGRA) 180 MG tablet Take 1 tablet (180 mg total) by mouth daily. 12/06/10 12/06/11  Agapito Games, MD  FLUoxetine (PROZAC) 20 MG tablet TAKE 1/2 TABLET BY MOUTH EVERY DAY FOR 1 WEEK THEN INCREASE TO 1 TABLET DAILY 02/09/11   Agapito Games, MD  lisinopril (PRINIVIL,ZESTRIL) 5 MG tablet Take 5 mg by mouth daily.      [provider]  Multiple Vitamin (MULTIVITAMIN) tablet Take 1 tablet by mouth daily.      [provider]  naproxen (NAPROSYN) 500 MG tablet Take 1 tablet (500 mg total) by mouth 2 (two) times daily. 08/01/14   Fayrene Helper, PA-C  nitroGLYCERIN (NITROSTAT) 0.4 MG SL tablet Place 0.4 mg under the tongue every 5 (five) minutes as needed.      [provider]  OLANZapine zydis (ZYPREXA) 5 MG disintegrating tablet TAKE 1 TABLET BY MOUTH AT BEDTIME 04/02/11   Agapito Games, MD  pantoprazole (PROTONIX) 40 MG tablet Take 40 mg by mouth daily.      [provider]  Probiotic Product (PROBIOTIC ACIDOPHILUS PO) Take by mouth.  [provider]  simvastatin (ZOCOR) 20 MG tablet Take 20 mg by mouth at bedtime.      [provider]    Allergies Patient has no known allergies.   REVIEW OF SYSTEMS  Negative except as noted here or in the History of Present Illness.   PHYSICAL EXAMINATION  Initial Vital Signs Blood pressure (!) 146/84, pulse (!) 50, temperature (!) 97.5 F (36.4 C), temperature source Oral, resp. rate 18, height 5\' 8"  (1.727 m), weight 91.6 kg (202 lb), SpO2 100 %.  Examination General: Well-developed, well-nourished male in no acute distress; appearance consistent with age of record HENT: normocephalic; atraumatic Eyes: pupils equal, round and reactive to light; extraocular muscles intact Neck: supple Heart: regular rate and rhythm;  bradycardia Lungs: clear to auscultation bilaterally Abdomen: soft; nondistended; nontender; no masses or hepatosplenomegaly; bowel sounds present GU: Left CVA tenderness Extremities: No deformity; full range of motion; pulses normal Neurologic: Awake, alert and oriented; motor function intact in all extremities and symmetric; no facial droop Skin: Warm and dry Psychiatric: Mildly agitated   RESULTS  Summary of this visit's results, reviewed by myself:   EKG Interpretation  Date/Time:    Ventricular Rate:    PR Interval:    QRS Duration:   QT Interval:    QTC Calculation:   R Axis:     Text Interpretation:        Laboratory Studies: Results for orders placed or performed during the hospital encounter of 01/31/17 (from the past 24 hour(s))  Urinalysis, Routine w reflex microscopic     Status: Abnormal   Collection Time: 01/31/17  5:45 AM  Result Value Ref Range   Color, Urine AMBER (A) YELLOW   APPearance CLOUDY (A) CLEAR   Specific Gravity, Urine >1.030 (H) 1.005 - 1.030   pH 6.0 5.0 - 8.0   Glucose, UA NEGATIVE NEGATIVE mg/dL   Hgb urine dipstick LARGE (A) NEGATIVE   Bilirubin Urine NEGATIVE NEGATIVE   Ketones, ur NEGATIVE NEGATIVE mg/dL   Protein, ur 30 (A) NEGATIVE mg/dL   Nitrite NEGATIVE NEGATIVE   Leukocytes, UA NEGATIVE NEGATIVE  Urinalysis, Microscopic (reflex)     Status: Abnormal   Collection Time: 01/31/17  5:45 AM  Result Value Ref Range   RBC / HPF TOO NUMEROUS TO COUNT 0 - 5 RBC/hpf   WBC, UA 0-5 0 - 5 WBC/hpf   Bacteria, UA NONE SEEN NONE SEEN   Squamous Epithelial / LPF 0-5 (A) NONE SEEN   Mucus PRESENT    Ca Oxalate Crys, UA PRESENT    Imaging Studies: Ct Renal Stone Study  Result Date: 01/31/2017 CLINICAL DATA:  LEFT flank pain for 1-1/2 hours. History of kidney stones and hypertension. EXAM: CT ABDOMEN AND PELVIS WITHOUT CONTRAST TECHNIQUE: Multidetector CT imaging of the abdomen and pelvis was performed following the standard protocol without  IV contrast. COMPARISON:  CT abdomen and pelvis November 14, 2015 FINDINGS: LOWER CHEST: Minimal dependent atelectasis. The visualized heart size is normal. No pericardial effusion. Streak artifact from pacer wires including within LEFT chest wall. HEPATOBILIARY: Normal. PANCREAS: Normal. SPLEEN: Normal. ADRENALS/URINARY TRACT: Kidneys are orthotopic, demonstrating normal size and morphology. Mild LEFT hydronephrosis to the level the ureteral pelvic junction where a 3 mm calculus is present ; LEFT interpolar stone on prior imaging study has likely migrated. Punctate LEFT interpolar nephrolithiasis. 3 mm RIGHT interpolar and 2 mm RIGHT upper pole nephrolithiasis. Limited assessment for renal masses on this nonenhanced examination. 3 cm unchanged exophytic LEFT lower pole  renal cyst. Urinary bladder is partially distended and unremarkable. Normal adrenal glands. STOMACH/BOWEL: The stomach, small and large bowel are normal in course and caliber without inflammatory changes, sensitivity decreased by lack of enteric contrast. Mild colonic diverticulosis. Punctate tip appendicolith without CT findings of acute appendicitis. VASCULAR/LYMPHATIC: Aortoiliac vessels are normal in course and caliber. Mild calcific atherosclerosis. No lymphadenopathy by CT size criteria. REPRODUCTIVE: Normal. OTHER: No intraperitoneal free fluid or free air. MUSCULOSKELETAL: Non-acute. Streak artifact from LEFT hip total arthroplasty. Small fat containing inguinal hernias. IMPRESSION: 3 mm LEFT ureteral pelvic junction calculus resulting in mild hydronephrosis. Residual bilateral nephrolithiasis measuring to 3 mm. Aortic Atherosclerosis (ICD10-I70.0). Electronically Signed   By: Awilda Metro M.D.   On: 01/31/2017 04:17    ED COURSE  Nursing notes and initial vitals signs, including pulse oximetry, reviewed.  Vitals:   01/31/17 0339 01/31/17 0400  BP: (!) 146/84   Pulse: (!) 50 62  Resp: 18   Temp: (!) 97.5 F (36.4 C)   TempSrc:  Oral   SpO2: 100% 100%  Weight: 91.6 kg (202 lb)   Height:  (1.727 m)     PROCEDURES    ED DIAGNOSES     ICD-10-CM   1. Ureterolithiasis N20.1        Dory Verdun, MD 01/31/17 [id number]    Paula Libra, MD 01/31/17 [phone]

## 2017-01-31 NOTE — ED Notes (Signed)
Pt feels better. States he would like to ambulate out. No complaints. Friend at bedside.

## 2017-01-31 NOTE — ED Notes (Signed)
Rates pain 4/10. Will medicate per order. Pt has a ride at bedside.

## 2017-01-31 NOTE — ED Notes (Signed)
Pt states no change in pain level. Still rates 9/10 MD aware and orders received.

## 2017-01-31 NOTE — ED Notes (Signed)
Returned from CT.

## 2017-01-31 NOTE — ED Notes (Signed)
To CT

## 2017-01-31 NOTE — ED Triage Notes (Signed)
C/o left flank pain that started 1.5 hours ago. C/o nausea. Denies any vomiting. Pt unable to sit still. Remote history of a kidney stones. Able to urinate without difficulty. Arrived via [place]

## 2017-01-31 NOTE — ED Notes (Addendum)
Pt states he feels much better. Denies nausea. Drinking juice.

## 2017-10-13 ENCOUNTER — Encounter (HOSPITAL_BASED_OUTPATIENT_CLINIC_OR_DEPARTMENT_OTHER): Payer: Self-pay | Admitting: Adult Health

## 2017-10-13 ENCOUNTER — Other Ambulatory Visit: Payer: Self-pay

## 2017-10-13 ENCOUNTER — Emergency Department (HOSPITAL_BASED_OUTPATIENT_CLINIC_OR_DEPARTMENT_OTHER)
Admission: EM | Admit: 2017-10-13 | Discharge: 2017-10-13 | Disposition: A | Discharge status: Home / Self Care | Payer: Non-veteran care | Attending: Emergency Medicine | Admitting: Emergency Medicine

## 2017-10-13 ENCOUNTER — Emergency Department (HOSPITAL_BASED_OUTPATIENT_CLINIC_OR_DEPARTMENT_OTHER): Payer: Non-veteran care

## 2017-10-13 DIAGNOSIS — Y999 Unspecified external cause status: Secondary | ICD-10-CM | POA: Diagnosis not present

## 2017-10-13 DIAGNOSIS — I10 Essential (primary) hypertension: Secondary | ICD-10-CM | POA: Diagnosis not present

## 2017-10-13 DIAGNOSIS — Y92410 Unspecified street and highway as the place of occurrence of the external cause: Secondary | ICD-10-CM | POA: Diagnosis not present

## 2017-10-13 DIAGNOSIS — Z950 Presence of cardiac pacemaker: Secondary | ICD-10-CM | POA: Diagnosis not present

## 2017-10-13 DIAGNOSIS — R072 Precordial pain: Secondary | ICD-10-CM | POA: Insufficient documentation

## 2017-10-13 DIAGNOSIS — Z79899 Other long term (current) drug therapy: Secondary | ICD-10-CM | POA: Insufficient documentation

## 2017-10-13 DIAGNOSIS — S52514A Nondisplaced fracture of right radial styloid process, initial encounter for closed fracture: Secondary | ICD-10-CM | POA: Diagnosis not present

## 2017-10-13 DIAGNOSIS — S6981XA Other specified injuries of right wrist, hand and finger(s), initial encounter: Secondary | ICD-10-CM | POA: Diagnosis present

## 2017-10-13 DIAGNOSIS — Y9389 Activity, other specified: Secondary | ICD-10-CM | POA: Diagnosis not present

## 2017-10-13 DIAGNOSIS — S52514G Nondisplaced fracture of right radial styloid process, subsequent encounter for closed fracture with delayed healing: Secondary | ICD-10-CM

## 2017-10-13 DIAGNOSIS — Z87891 Personal history of nicotine dependence: Secondary | ICD-10-CM | POA: Diagnosis not present

## 2017-10-13 MED ORDER — IBUPROFEN 800 MG PO TABS: 800.0000 mg | ORAL_TABLET | Freq: Three times a day (TID) | ORAL | 0 refills | Status: DC

## 2017-10-13 MED ORDER — HYDROCODONE-ACETAMINOPHEN 5-325 MG PO TABS: 2.0000 | ORAL_TABLET | Freq: Four times a day (QID) | ORAL | 0 refills | Status: DC | PRN

## 2017-10-13 MED ORDER — IBUPROFEN 800 MG PO TABS: 800.0000 mg | ORAL_TABLET | Freq: Once | ORAL | Status: DC

## 2017-10-13 MED ORDER — HYDROCODONE-ACETAMINOPHEN 5-325 MG PO TABS
1.0000 | ORAL_TABLET | Freq: Once | ORAL | Status: AC
  Administered 2017-10-13: 1 via ORAL
  Filled 2017-10-13: qty 1

## 2017-10-13 MED ORDER — METHOCARBAMOL 500 MG PO TABS: 500.0000 mg | ORAL_TABLET | Freq: Every evening | ORAL | 0 refills | Status: DC | PRN

## 2017-10-13 MED FILL — IBUPROFEN 800 MG TAB: 800 | 7 days supply | Qty: 21 | Fill #0

## 2017-10-13 MED FILL — HYDROCODON-APAP 5-325: 5-325 | 1 days supply | Qty: 6 | Fill #0

## 2017-10-13 MED FILL — METHOCARBAMOL 500 MG TABLET: 500 | 20 days supply | Qty: 20 | Fill #0
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
## 2017-12-21 ENCOUNTER — Other Ambulatory Visit: Payer: Self-pay

## 2017-12-21 ENCOUNTER — Encounter (HOSPITAL_BASED_OUTPATIENT_CLINIC_OR_DEPARTMENT_OTHER): Payer: Self-pay | Admitting: *Deleted

## 2017-12-21 ENCOUNTER — Emergency Department (HOSPITAL_BASED_OUTPATIENT_CLINIC_OR_DEPARTMENT_OTHER): Payer: Non-veteran care

## 2017-12-21 ENCOUNTER — Emergency Department (HOSPITAL_BASED_OUTPATIENT_CLINIC_OR_DEPARTMENT_OTHER)
Admission: EM | Admit: 2017-12-21 | Discharge: 2017-12-21 | Disposition: A | Discharge status: Home / Self Care | Payer: Non-veteran care | Attending: Emergency Medicine | Admitting: Emergency Medicine

## 2017-12-21 DIAGNOSIS — I493 Ventricular premature depolarization: Secondary | ICD-10-CM | POA: Diagnosis not present

## 2017-12-21 DIAGNOSIS — Z95810 Presence of automatic (implantable) cardiac defibrillator: Secondary | ICD-10-CM | POA: Diagnosis not present

## 2017-12-21 DIAGNOSIS — E876 Hypokalemia: Secondary | ICD-10-CM | POA: Diagnosis not present

## 2017-12-21 DIAGNOSIS — R002 Palpitations: Secondary | ICD-10-CM | POA: Diagnosis not present

## 2017-12-21 DIAGNOSIS — F17220 Nicotine dependence, chewing tobacco, uncomplicated: Secondary | ICD-10-CM | POA: Diagnosis not present

## 2017-12-21 LAB — CBC WITH DIFFERENTIAL/PLATELET
BASOS ABS: 0.1 10*3/uL (ref 0.0–0.1)
Basophils Relative: 1 %
Eosinophils Absolute: 0.4 10*3/uL (ref 0.0–0.7)
Eosinophils Relative: 4 %
HCT: 41 % (ref 39.0–52.0)
HEMOGLOBIN: 14 g/dL (ref 13.0–17.0)
LYMPHS PCT: 31 %
Lymphs Abs: 2.9 10*3/uL (ref 0.7–4.0)
MCH: 29.7 pg (ref 26.0–34.0)
MCHC: 34.1 g/dL (ref 30.0–36.0)
MCV: 86.9 fL (ref 78.0–100.0)
Monocytes Absolute: 1 10*3/uL (ref 0.1–1.0)
Monocytes Relative: 11 %
NEUTROS ABS: 5.2 10*3/uL (ref 1.7–7.7)
NEUTROS PCT: 53 %
Platelets: 284 10*3/uL (ref 150–400)
RBC: 4.72 MIL/uL (ref 4.22–5.81)
RDW: 13.2 % (ref 11.5–15.5)
WBC: 9.6 10*3/uL (ref 4.0–10.5)

## 2017-12-21 LAB — BASIC METABOLIC PANEL
ANION GAP: 12 (ref 5–15)
BUN: 29 mg/dL — ABNORMAL HIGH (ref 6–20)
CO2: 22 mmol/L (ref 22–32)
Calcium: 9.2 mg/dL (ref 8.9–10.3)
Chloride: 107 mmol/L (ref 98–111)
Creatinine, Ser: 1.42 mg/dL — ABNORMAL HIGH (ref 0.61–1.24)
GFR calc Af Amer: >60 mL/min (ref >60)
GFR, EST NON AFRICAN AMERICAN: 58 mL/min — AB (ref >60)
GLUCOSE: 123 mg/dL — AB (ref 70–99)
POTASSIUM: 3.4 mmol/L — AB (ref 3.5–5.1)
Sodium: 141 mmol/L (ref 135–145)

## 2017-12-21 LAB — TROPONIN I: Troponin I: <0.03 ng/mL (ref <0.03)

## 2017-12-21 LAB — MAGNESIUM: Magnesium: 2.4 mg/dL (ref 1.7–2.4)

## 2017-12-21 MED ORDER — POTASSIUM CHLORIDE CRYS ER 20 MEQ PO TBCR: 20.0000 meq | EXTENDED_RELEASE_TABLET | Freq: Two times a day (BID) | ORAL | 0 refills | Status: DC

## 2017-12-21 MED ORDER — SODIUM CHLORIDE 0.9 % IV BOLUS
500.0000 mL | Freq: Once | INTRAVENOUS | Status: AC
  Administered 2017-12-21: 500 mL via INTRAVENOUS

## 2017-12-21 NOTE — Discharge Instructions (Signed)
You were seen in the ED today with heart palpitations. We are replacing your potassium. You will need to have repeat blood work as well. Your kidneys are showing some sign of dehydration as well. Make sure your PCP repeats your labs soon to make sure your kidney labs are not getting worse. Return to the ED with any chest pain, worsening palpations, shortness of breathing, or passing out symptoms.
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
## 2017-12-21 NOTE — ED Triage Notes (Signed)
Pt c/o palpitations x3 days

## 2018-01-29 ENCOUNTER — Encounter (HOSPITAL_BASED_OUTPATIENT_CLINIC_OR_DEPARTMENT_OTHER): Payer: Self-pay

## 2018-01-29 ENCOUNTER — Other Ambulatory Visit: Payer: Self-pay

## 2018-01-29 ENCOUNTER — Emergency Department (HOSPITAL_BASED_OUTPATIENT_CLINIC_OR_DEPARTMENT_OTHER): Payer: No Typology Code available for payment source

## 2018-01-29 ENCOUNTER — Emergency Department (HOSPITAL_BASED_OUTPATIENT_CLINIC_OR_DEPARTMENT_OTHER)
Admission: EM | Admit: 2018-01-29 | Discharge: 2018-01-30 | Disposition: A | Discharge status: Home / Self Care | Payer: No Typology Code available for payment source | Attending: Emergency Medicine | Admitting: Emergency Medicine

## 2018-01-29 DIAGNOSIS — Z95810 Presence of automatic (implantable) cardiac defibrillator: Secondary | ICD-10-CM | POA: Diagnosis not present

## 2018-01-29 DIAGNOSIS — I10 Essential (primary) hypertension: Secondary | ICD-10-CM | POA: Insufficient documentation

## 2018-01-29 DIAGNOSIS — F17210 Nicotine dependence, cigarettes, uncomplicated: Secondary | ICD-10-CM | POA: Insufficient documentation

## 2018-01-29 DIAGNOSIS — R1010 Upper abdominal pain, unspecified: Secondary | ICD-10-CM

## 2018-01-29 DIAGNOSIS — R1011 Right upper quadrant pain: Secondary | ICD-10-CM | POA: Insufficient documentation

## 2018-01-29 DIAGNOSIS — R109 Unspecified abdominal pain: Secondary | ICD-10-CM

## 2018-01-29 LAB — URINALYSIS, MICROSCOPIC (REFLEX): BACTERIA UA: NONE SEEN

## 2018-01-29 LAB — COMPREHENSIVE METABOLIC PANEL
ALBUMIN: 4 g/dL (ref 3.5–5.0)
ALT: 18 U/L (ref 0–44)
ANION GAP: 10 (ref 5–15)
AST: 22 U/L (ref 15–41)
Alkaline Phosphatase: 88 U/L (ref 38–126)
BILIRUBIN TOTAL: 0.5 mg/dL (ref 0.3–1.2)
BUN: 20 mg/dL (ref 6–20)
CO2: 25 mmol/L (ref 22–32)
Calcium: 8.9 mg/dL (ref 8.9–10.3)
Chloride: 102 mmol/L (ref 98–111)
Creatinine, Ser: 0.85 mg/dL (ref 0.61–1.24)
GFR calc Af Amer: >60 mL/min (ref >60)
Glucose, Bld: 107 mg/dL — ABNORMAL HIGH (ref 70–99)
POTASSIUM: 3.9 mmol/L (ref 3.5–5.1)
Sodium: 137 mmol/L (ref 135–145)
TOTAL PROTEIN: 6.7 g/dL (ref 6.5–8.1)

## 2018-01-29 LAB — URINALYSIS, ROUTINE W REFLEX MICROSCOPIC
Bilirubin Urine: NEGATIVE
Glucose, UA: NEGATIVE mg/dL
KETONES UR: NEGATIVE mg/dL
LEUKOCYTES UA: NEGATIVE
Nitrite: NEGATIVE
PROTEIN: NEGATIVE mg/dL
Specific Gravity, Urine: 1.02 (ref 1.005–1.030)
pH: 6.5 (ref 5.0–8.0)

## 2018-01-29 LAB — LIPASE, BLOOD: Lipase: 33 U/L (ref 11–51)

## 2018-01-29 LAB — CBC
HEMATOCRIT: 37.8 % — AB (ref 39.0–52.0)
Hemoglobin: 12.8 g/dL — ABNORMAL LOW (ref 13.0–17.0)
MCH: 29.6 pg (ref 26.0–34.0)
MCHC: 33.9 g/dL (ref 30.0–36.0)
MCV: 87.5 fL (ref 78.0–100.0)
Platelets: 257 10*3/uL (ref 150–400)
RBC: 4.32 MIL/uL (ref 4.22–5.81)
RDW: 12.7 % (ref 11.5–15.5)
WBC: 7.7 10*3/uL (ref 4.0–10.5)

## 2018-01-29 MED ORDER — IOPAMIDOL (ISOVUE-300) INJECTION 61%
100.0000 mL | Freq: Once | INTRAVENOUS | Status: AC | PRN
Start: 2018-01-29 — End: 2018-01-29
  Administered 2018-01-29: 100 mL via INTRAVENOUS

## 2018-01-29 MED ORDER — MORPHINE SULFATE (PF) 4 MG/ML IV SOLN
4.0000 mg | Freq: Once | INTRAVENOUS | Status: AC
  Administered 2018-01-29: 4 mg via INTRAVENOUS
  Filled 2018-01-29: qty 1

## 2018-01-29 NOTE — ED Provider Notes (Signed)
[facility] EMERGENCY DEPARTMENT Provider Note   CSN: [id number] Arrival date & time: 01/29/18  2106     History   Chief Complaint Chief Complaint  Patient presents with  . Flank Pain    HPI Montravious Despain is a 47 y.o. male.  HPI Patient is a 47 year old male presents to the emergency department with severe right flank pain radiating to the right upper quadrant present over the past 3 days.  His pain is waxed and waned but now is more constant.  He has a history of kidney stones and states this feels similar.  No prior history of gallstones.  No fevers or chills.  Denies nausea or vomiting.  No dysuria or urinary frequency.  Pain is moderate in severity   Past Medical History:  Diagnosis Date  . Hypertension   . Kidney stones   . 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 Seborrheic keratosis 09/10/2010  . Cardiomyopathy 08/13/2010  . 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 Hypertension 08/13/2010  . Other and unspecified hyperlipidemia 08/13/2010  . Anxiety 08/13/2010  . Ankle pain 08/13/2010    Past Surgical History:  Procedure Laterality Date  . CARDIAC DEFIBRILLATOR PLACEMENT     x 2   . CARDIAC PACEMAKER PLACEMENT     x 2   . HIP SURGERY    . left knee surgery  2007  . PACEMAKER REVISION    . SHOULDER SURGERY    . skin grafts  2006   x 2         Home Medications    Prior to Admission medications   Medication Sig Start Date End Date Taking? Authorizing Provider  carvedilol (COREG) 25 MG tablet Take 25 mg by mouth 2 (two) times daily with a meal.     Yes [provider]  clonazePAM (KLONOPIN) 0.5 MG tablet Take by mouth.   Yes [provider]  lisinopril (PRINIVIL,ZESTRIL) 5 MG tablet Take 5 mg by mouth daily.     Yes [provider]  Multiple Vitamin (MULTIVITAMIN) tablet Take 1 tablet by mouth daily.     Yes  [provider]  PARoxetine (PAXIL) 20 MG tablet Take by mouth.   Yes [provider]  nitroGLYCERIN (NITROSTAT) 0.4 MG SL tablet Place 0.4 mg under the tongue every 5 (five) minutes as needed.      [provider]  pantoprazole (PROTONIX) 40 MG tablet Take 40 mg by mouth daily.      [provider]  potassium chloride SA (K-DUR,KLOR-CON) 20 MEQ tablet Take 1 tablet (20 mEq total) by mouth 2 (two) times daily for 4 days. 12/21/17 12/25/17  Long, Arlyss Repress, MD  Probiotic Product (PROBIOTIC ACIDOPHILUS PO) Take by mouth.      [provider]  simvastatin (ZOCOR) 20 MG tablet Take 20 mg by mouth at bedtime.      [provider]    Family History Family History  Problem Relation Age of Onset  . Bipolar disorder Brother   . Diabetes type II Father     Social History Social History   Tobacco Use  . Smoking status: Current Every Day Smoker  . Smokeless tobacco: Current User    Types: Snuff    Last attempt to quit: 06/15/2003  Substance Use Topics  . Alcohol use: No  . Drug use: No     Allergies  Patient has no known allergies.   Review of Systems Review of Systems  All other systems reviewed and are negative.    Physical Exam Updated Vital Signs BP 131/88 (BP Location: Left Arm)   Pulse 86   Temp 97.8 F (36.6 C) (Oral)   Resp 20   Ht 5\' 8"  (1.727 m)   Wt 99.8 kg   SpO2 100%   BMI 33.45 kg/m   Physical Exam  Constitutional: He is oriented to person, place, and time. He appears well-developed and well-nourished.  HENT:  Head: Normocephalic and atraumatic.  Eyes: EOM are normal.  Neck: Normal range of motion.  Cardiovascular: Normal rate, regular rhythm, normal heart sounds and intact distal pulses.  Pulmonary/Chest: Effort normal and breath sounds normal. No respiratory distress.  Abdominal: Soft. He exhibits no distension. There is no tenderness.  Musculoskeletal: Normal range of motion.  Neurological: He is  alert and oriented to person, place, and time.  Skin: Skin is warm and dry.  No signs of zoster in his right flank or right upper quadrant  Psychiatric: He has a normal mood and affect. Judgment normal.  Nursing note and vitals reviewed.    ED Treatments / Results  Labs (all labs ordered are listed, but only abnormal results are displayed) Labs Reviewed  URINALYSIS, ROUTINE W REFLEX MICROSCOPIC - Abnormal; Notable for the following components:      Result Value   Hgb urine dipstick TRACE (*)    All other components within normal limits  CBC - Abnormal; Notable for the following components:   Hemoglobin 12.8 (*)    HCT 37.8 (*)    All other components within normal limits  COMPREHENSIVE METABOLIC PANEL - Abnormal; Notable for the following components:   Glucose, Bld 107 (*)    All other components within normal limits  LIPASE, BLOOD  URINALYSIS, MICROSCOPIC (REFLEX)    EKG None  Radiology Ct Abdomen Pelvis W Contrast  Result Date: 01/29/2018 CLINICAL DATA:  RIGHT flank pain for 3 days; similar symptoms with prior kidney stones. EXAM: CT ABDOMEN AND PELVIS WITH CONTRAST TECHNIQUE: Multidetector CT imaging of the abdomen and pelvis was performed using the standard protocol following bolus administration of intravenous contrast. CONTRAST:  ISOVUE-300 IOPAMIDOL (ISOVUE-300) INJECTION 61% COMPARISON:  CT abdomen and pelvis January 31, 2017 FINDINGS: LOWER CHEST: Streak artifact from pacemaker leads. Dependent atelectasis. Included heart size is normal. No pericardial effusion. HEPATOBILIARY: Liver and gallbladder are normal. PANCREAS: Normal. SPLEEN: Normal. ADRENALS/URINARY TRACT: Kidneys are orthotopic, demonstrating symmetric enhancement. 3 mm RIGHT interpolar nephrolithiasis. No hydronephrosis or solid renal masses. Too small to characterize hypodensities bilateral kidneys. Homogeneously hypodense 3.6 cm exophytic LEFT lower pole benign-appearing cyst. The unopacified ureters  are normal in course and caliber. Delayed imaging through the kidneys demonstrates symmetric prompt contrast excretion within the proximal urinary collecting system. Urinary bladder is partially distended and unremarkable. Normal adrenal glands. STOMACH/BOWEL: The stomach, small and large bowel are normal in course and caliber without inflammatory changes, sensitivity decreased without oral contrast. Mild sigmoid diverticulosis. Normal appendix. VASCULAR/LYMPHATIC: Aortoiliac vessels are normal in course and caliber, mild calcific atherosclerosis. No lymphadenopathy by CT size criteria. REPRODUCTIVE: Normal. OTHER: No intraperitoneal free fluid or free air. Small LEFT greater than RIGHT fat containing inguinal hernias, increased on the LEFT from prior CT. MUSCULOSKELETAL: Nonacute. Streak artifact from LEFT hip arthroplasty. Mild RIGHT hip osteoarthrosis. 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 IMPRESSION: 1. 3 mm nonobstructing RIGHT nephrolithiasis. 2. No acute intra-abdominal/pelvic process. Aortic Atherosclerosis (ICD10-I70.0).  Electronically Signed   By: Awilda Metro M.D.   On: 01/29/2018 23:43    Procedures Procedures (including critical care time)  Medications Ordered in ED Medications  morphine 4 MG/ML injection 4 mg (4 mg Intravenous Given 01/29/18 2214)  iopamidol (ISOVUE-300) 61 % injection 100 mL (100 mLs Intravenous Contrast Given 01/29/18 2316)  morphine 4 MG/ML injection 4 mg (4 mg Intravenous Given 01/29/18 2333)     Initial Impression / Assessment and Plan / ED Course  I have reviewed the triage vital signs and the nursing notes.  Pertinent labs & imaging results that were available during my care of the patient were reviewed by me and considered in my medical decision making (see chart for details).     Work-up in the emergency department without significant abnormality.  No ureteral stones.  Gallbladder without abnormality on CT imaging.  Patient will need close  primary care follow-up.  No indication for additional work-up at this time.  Patient encouraged to return to the emergency department for new or worsening symptoms  Final Clinical Impressions(s) / ED Diagnoses   Final diagnoses:  Upper abdominal pain  Acute abdominal pain  Acute right flank pain    ED Discharge Orders    None       Azalia Bilis, MD 01/30/18 0000

## 2018-01-29 NOTE — ED Notes (Signed)
Pt has specimen cup at bedside. States he can;t provide urine sample yet.

## 2018-01-29 NOTE — ED Notes (Signed)
ED Provider at bedside. 

## 2018-01-29 NOTE — ED Triage Notes (Signed)
Pt c/o R flank pain that feels like previous kidney stone pain. Started 3 days ago.

## 2018-03-04 ENCOUNTER — Encounter (HOSPITAL_BASED_OUTPATIENT_CLINIC_OR_DEPARTMENT_OTHER): Payer: Self-pay | Admitting: Emergency Medicine

## 2018-03-04 ENCOUNTER — Emergency Department (HOSPITAL_BASED_OUTPATIENT_CLINIC_OR_DEPARTMENT_OTHER)
Admission: EM | Admit: 2018-03-04 | Discharge: 2018-03-04 | Disposition: A | Discharge status: Home / Self Care | Payer: Non-veteran care | Attending: Emergency Medicine | Admitting: Emergency Medicine

## 2018-03-04 ENCOUNTER — Other Ambulatory Visit: Payer: Self-pay

## 2018-03-04 DIAGNOSIS — F17220 Nicotine dependence, chewing tobacco, uncomplicated: Secondary | ICD-10-CM | POA: Diagnosis not present

## 2018-03-04 DIAGNOSIS — Z96642 Presence of left artificial hip joint: Secondary | ICD-10-CM | POA: Diagnosis not present

## 2018-03-04 DIAGNOSIS — Z950 Presence of cardiac pacemaker: Secondary | ICD-10-CM | POA: Insufficient documentation

## 2018-03-04 DIAGNOSIS — I10 Essential (primary) hypertension: Secondary | ICD-10-CM | POA: Diagnosis not present

## 2018-03-04 DIAGNOSIS — M25552 Pain in left hip: Secondary | ICD-10-CM | POA: Insufficient documentation

## 2018-03-04 DIAGNOSIS — Z79899 Other long term (current) drug therapy: Secondary | ICD-10-CM | POA: Insufficient documentation

## 2018-03-04 DIAGNOSIS — G8929 Other chronic pain: Secondary | ICD-10-CM | POA: Insufficient documentation

## 2018-03-04 NOTE — ED Triage Notes (Addendum)
Patient states that he has a hip a replacement abut 2 years ago  - he needs another left hip placement and is waiting for the doctor to approve the revision. The patient states that he continues to have the hip pain - patient states " I just need something to help me at night" - patient states that he also went to his PMD and asked for pain medications as well as something to help him sleep and was told that he could not have medications for both.

## 2018-03-04 NOTE — Discharge Instructions (Addendum)
Continue ice, Tylenol and light duty and follow-up with your [facility]opedic doctor and [facility].

## 2018-03-04 NOTE — ED Notes (Signed)
PT requesting oxycodone for night time use and chronic hip and leg pain.

## 2018-03-04 NOTE — ED Provider Notes (Signed)
[facility] EMERGENCY DEPARTMENT Provider Note   CSN: [id number] Arrival date & time: 03/04/18  1648     History   Chief Complaint Chief Complaint  Patient presents with  . Hip Pain    HPI Terry Mcconnell is a 47 y.o. male.  Patient with history of left hip replacement by [facility], chronic left hip pain worsening the past 2 months presents requesting narcotic prescription to help him at night.  Patient does have strain on his left hip regularly at work.  Patient is waiting to get an another appointment with [facility] through the [place].  Pain worse with stepping up and standing.  No new injuries.  No fevers.     Past Medical History:  Diagnosis Date  . Hypertension   . Kidney stones   . 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 Seborrheic keratosis 09/10/2010  . Cardiomyopathy 08/13/2010  . 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 Hypertension 08/13/2010  . Other and unspecified hyperlipidemia 08/13/2010  . Anxiety 08/13/2010  . Ankle pain 08/13/2010    Past Surgical History:  Procedure Laterality Date  . CARDIAC DEFIBRILLATOR PLACEMENT     x 2   . CARDIAC PACEMAKER PLACEMENT     x 2   . HIP SURGERY    . left knee surgery  2007  . PACEMAKER REVISION    . SHOULDER SURGERY    . skin grafts  2006   x 2         Home Medications    Prior to Admission medications   Medication Sig Start Date End Date Taking? Authorizing Provider  carvedilol (COREG) 25 MG tablet Take 25 mg by mouth 2 (two) times daily with a meal.      [provider]  clonazePAM (KLONOPIN) 0.5 MG tablet Take by mouth.    [provider]  lisinopril (PRINIVIL,ZESTRIL) 5 MG tablet Take 5 mg by mouth daily.      [provider]  Multiple Vitamin (MULTIVITAMIN) tablet Take 1 tablet by mouth daily.      [provider]  nitroGLYCERIN (NITROSTAT) 0.4  MG SL tablet Place 0.4 mg under the tongue every 5 (five) minutes as needed.      [provider]  pantoprazole (PROTONIX) 40 MG tablet Take 40 mg by mouth daily.      [provider]  PARoxetine (PAXIL) 20 MG tablet Take by mouth.    [provider]  potassium chloride SA (K-DUR,KLOR-CON) 20 MEQ tablet Take 1 tablet (20 mEq total) by mouth 2 (two) times daily for 4 days. 12/21/17 12/25/17  Long, Arlyss Repress, MD  Probiotic Product (PROBIOTIC ACIDOPHILUS PO) Take by mouth.      [provider]  simvastatin (ZOCOR) 20 MG tablet Take 20 mg by mouth at bedtime.      [provider]    Family History Family History  Problem Relation Age of Onset  . Bipolar disorder Brother   . Diabetes type II Father     Social History Social History   Tobacco Use  . Smoking status: Current Every Day Smoker  . Smokeless tobacco: Current User    Types: Snuff    Last attempt to quit: 06/15/2003  Substance Use Topics  . Alcohol use: No  . Drug use: No     Allergies   Patient has no known allergies.   Review of  Systems Review of Systems  Constitutional: Negative for chills and fever.  Gastrointestinal: Negative for abdominal pain and vomiting.  Musculoskeletal: Positive for gait problem. Negative for back pain, neck pain and neck stiffness.  Skin: Negative for rash.  Neurological: Negative for weakness, light-headedness and headaches.     Physical Exam Updated Vital Signs BP (!) 139/92 (BP Location: Left Arm)   Pulse 89   Temp 98.3 F (36.8 C) (Oral)   Resp 20   Ht 5\' 8"  (1.727 m)   Wt 99.8 kg   SpO2 100%   BMI 33.45 kg/m   Physical Exam  Constitutional: He appears well-developed and well-nourished.  HENT:  Head: Normocephalic and atraumatic.  Eyes: Right eye exhibits no discharge. Left eye exhibits no discharge.  Neck: Neck supple. No tracheal deviation present.  Cardiovascular: Normal rate.  Pulmonary/Chest: Effort normal.    Musculoskeletal: He exhibits tenderness. He exhibits no edema.  Patient has mild tenderness with hip flexion.  Patient can stand with mild discomfort.  Patient has chronic scar and deformity to left lower anterior tibia.  Neurological: He is alert.  Skin: Skin is warm. No rash noted.  Psychiatric: He has a normal mood and affect.  Nursing note and vitals reviewed.    ED Treatments / Results  Labs (all labs ordered are listed, but only abnormal results are displayed) Labs Reviewed - No data to display  EKG None  Radiology No results found.  Procedures Procedures (including critical care time)  Medications Ordered in ED Medications - No data to display   Initial Impression / Assessment and Plan / ED Course  I have reviewed the triage vital signs and the nursing notes.  Pertinent labs & imaging results that were available during my care of the patient were reviewed by me and considered in my medical decision making (see chart for details).    Patient with chronic hip pain having difficulty getting back in with his orthopedist at [facility] presents with worsening pain.  No new injuries.  No concern for infection.  We had a long discussion regarding treatment options and that only his orthopedic doctor or primary care/pain specialist should be prescribing narcotic medication.  Patient understands and will follow up with them.  Final Clinical Impressions(s) / ED Diagnoses   Final diagnoses:  Chronic left hip pain    ED Discharge Orders    None       Blane Ohara, MD 03/04/18 1911

## 2018-03-04 NOTE — ED Notes (Signed)
Pt/family verbalized understanding of discharge instructions.   

## 2018-03-09 ENCOUNTER — Emergency Department (HOSPITAL_BASED_OUTPATIENT_CLINIC_OR_DEPARTMENT_OTHER)
Admission: EM | Admit: 2018-03-09 | Discharge: 2018-03-09 | Disposition: A | Discharge status: Home / Self Care | Payer: Non-veteran care | Attending: Emergency Medicine | Admitting: Emergency Medicine

## 2018-03-09 ENCOUNTER — Other Ambulatory Visit: Payer: Self-pay

## 2018-03-09 ENCOUNTER — Encounter (HOSPITAL_BASED_OUTPATIENT_CLINIC_OR_DEPARTMENT_OTHER): Payer: Self-pay

## 2018-03-09 DIAGNOSIS — Z950 Presence of cardiac pacemaker: Secondary | ICD-10-CM | POA: Diagnosis not present

## 2018-03-09 DIAGNOSIS — F17210 Nicotine dependence, cigarettes, uncomplicated: Secondary | ICD-10-CM | POA: Insufficient documentation

## 2018-03-09 DIAGNOSIS — Z96642 Presence of left artificial hip joint: Secondary | ICD-10-CM | POA: Diagnosis not present

## 2018-03-09 DIAGNOSIS — G8929 Other chronic pain: Secondary | ICD-10-CM | POA: Insufficient documentation

## 2018-03-09 DIAGNOSIS — I10 Essential (primary) hypertension: Secondary | ICD-10-CM | POA: Diagnosis not present

## 2018-03-09 DIAGNOSIS — Z79899 Other long term (current) drug therapy: Secondary | ICD-10-CM | POA: Insufficient documentation

## 2018-03-09 DIAGNOSIS — M25552 Pain in left hip: Secondary | ICD-10-CM | POA: Insufficient documentation

## 2018-03-09 MED ORDER — KETOROLAC TROMETHAMINE 60 MG/2ML IM SOLN
30.0000 mg | Freq: Once | INTRAMUSCULAR | Status: AC
  Administered 2018-03-09: 30 mg via INTRAMUSCULAR
  Filled 2018-03-09: qty 2

## 2018-03-09 MED ORDER — OXYCODONE-ACETAMINOPHEN 5-325 MG PO TABS
2.0000 | ORAL_TABLET | Freq: Once | ORAL | Status: AC
  Administered 2018-03-09: 2 via ORAL
  Filled 2018-03-09: qty 2

## 2018-03-09 NOTE — ED Triage Notes (Signed)
[facility] report-pt c/o hip pain with hx of hip replacement 1 year ago-pt is followed by the [facility]

## 2018-03-09 NOTE — Discharge Instructions (Signed)
Please read and follow all provided instructions.  Your diagnoses today include:  1. Chronic left hip pain    Tests performed today include:  Vital signs. See below for your results today.   Medications prescribed:   None  Take any prescribed medications only as directed.  Home care instructions:   Follow any educational materials contained in this packet  Follow R.I.C.E. Protocol:  R - rest your injury   I  - use ice on injury without applying directly to skin  C - compress injury with bandage or splint  E - elevate the injury as much as possible  Follow-up instructions: Please follow-up with your doctors as planned for continued management of your pain and hip problems.   Return instructions:   Please return if your toes or feet are numb or tingling, appear gray or blue, or you have severe pain (also elevate the leg and loosen splint or wrap if you were given one)  Please return to the Emergency Department if you experience worsening symptoms.   Please return if you have any other emergent concerns.  Additional Information:  Your vital signs today were: BP (!) 143/90 (BP Location: Left Arm)    Pulse 89    Temp 98.6 F (37 C) (Oral)    Resp 20    Ht 5\' 8"  (1.727 m)    Wt 99.7 kg    SpO2 98%    BMI 33.44 kg/m  If your blood pressure (BP) was elevated above 135/85 this visit, please have this repeated by your doctor within one month. --------------

## 2018-03-09 NOTE — ED Provider Notes (Signed)
[facility] EMERGENCY DEPARTMENT Provider Note   CSN: [id number] Arrival date & time: 03/09/18  1145     History   Chief Complaint No chief complaint on file.   HPI Terry Mcconnell is a 47 y.o. male.  Patient with a history of left hip replacement presents with worsening chronic left hip pain.  Patient reports pain with movement that is "deep" in his left hip.  He states that the pain is beginning to radiate to his testicle which is reminiscent of prior to when he had his hip replaced in the past.  He states he was seen in the emergency department a week ago and was prescribed tramadol.  He states that this does not help at all.  He has been using topical medications as well without any relief.  He denies any new numbness or tingling in the leg.  He has difficulty walking due to pain.  He states that his primary care doctor will not prescribe any pain medication to him up because he has an orthopedist.  However they are still obtaining his records and they are unable to see him.  He states he has been calling their office to try to schedule an appointment.  He anticipates having his hip redone in the next few months.  No fever.      Past Medical History:  Diagnosis Date  . Hypertension   . Kidney stones   . 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 Seborrheic keratosis 09/10/2010  . Cardiomyopathy 08/13/2010  . 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 Hypertension 08/13/2010  . Other and unspecified hyperlipidemia 08/13/2010  . Anxiety 08/13/2010  . Ankle pain 08/13/2010    Past Surgical History:  Procedure Laterality Date  . CARDIAC DEFIBRILLATOR PLACEMENT     x 2   . CARDIAC PACEMAKER PLACEMENT     x 2   . HIP SURGERY    . left knee surgery  2007  . PACEMAKER REVISION    . SHOULDER SURGERY    . skin grafts  2006   x 2         Home Medications    Prior  to Admission medications   Medication Sig Start Date End Date Taking? Authorizing Provider  carvedilol (COREG) 25 MG tablet Take 25 mg by mouth 2 (two) times daily with a meal.      [provider]  clonazePAM (KLONOPIN) 0.5 MG tablet Take by mouth.    [provider]  lisinopril (PRINIVIL,ZESTRIL) 5 MG tablet Take 5 mg by mouth daily.      [provider]  Multiple Vitamin (MULTIVITAMIN) tablet Take 1 tablet by mouth daily.      [provider]  nitroGLYCERIN (NITROSTAT) 0.4 MG SL tablet Place 0.4 mg under the tongue every 5 (five) minutes as needed.      [provider]  pantoprazole (PROTONIX) 40 MG tablet Take 40 mg by mouth daily.      [provider]  PARoxetine (PAXIL) 20 MG tablet Take by mouth.    [provider]  potassium chloride SA (K-DUR,KLOR-CON) 20 MEQ tablet Take 1 tablet (20 mEq total) by mouth 2 (two) times daily for 4 days. 12/21/17 12/25/17  Long, Arlyss Repress, MD  Probiotic Product (PROBIOTIC ACIDOPHILUS PO) Take by mouth.      [provider]  simvastatin (ZOCOR) 20 MG tablet Take 20 mg  by mouth at bedtime.      [provider]    Family History Family History  Problem Relation Age of Onset  . Bipolar disorder Brother   . Diabetes type II Father     Social History Social History   Tobacco Use  . Smoking status: Current Every Day Smoker  . Smokeless tobacco: Current User    Types: Snuff    Last attempt to quit: 06/15/2003  Substance Use Topics  . Alcohol use: No  . Drug use: No     Allergies   Patient has no known allergies.   Review of Systems Review of Systems  Constitutional: Negative for activity change.  Musculoskeletal: Positive for arthralgias and gait problem. Negative for back pain, joint swelling and neck pain.  Skin: Negative for wound.  Neurological: Negative for weakness and numbness.     Physical Exam Updated Vital Signs BP (!) 135/91   Pulse 78   Temp 98.6  F (37 C) (Oral)   Resp 20   Ht 5\' 8"  (1.727 m)   Wt 99.7 kg   SpO2 97%   BMI 33.44 kg/m   Physical Exam  Constitutional: He appears well-developed and well-nourished.  HENT:  Head: Normocephalic and atraumatic.  Eyes: Conjunctivae are normal.  Neck: Normal range of motion.  Cardiovascular:  Pulses:      Dorsalis pedis pulses are 2+ on the right side, and 2+ on the left side.  Abdominal: Soft. There is no tenderness. There is no CVA tenderness.  Musculoskeletal:       Left hip: He exhibits tenderness. He exhibits normal range of motion, normal strength, no bony tenderness and no swelling.       Left knee: Normal.       Lumbar back: He exhibits normal range of motion, no tenderness, no bony tenderness and no swelling.       Legs: No step-off noted with palpation of spine.   Neurological: He is alert. He has normal reflexes. No sensory deficit. He exhibits normal muscle tone.  5/5 strength in entire lower extremities bilaterally. No sensation deficit.   Skin: Skin is warm and dry.  Psychiatric: He has a normal mood and affect.  Nursing note and vitals reviewed.    ED Treatments / Results  Labs (all labs ordered are listed, but only abnormal results are displayed) Labs Reviewed - No data to display  EKG None  Radiology No results found.  Procedures Procedures (including critical care time)  Medications Ordered in ED Medications  oxyCODONE-acetaminophen (PERCOCET/ROXICET) 5-325 MG per tablet 2 tablet (2 tablets Oral Given 03/09/18 1411)  ketorolac (TORADOL) injection 30 mg (30 mg Intramuscular Given 03/09/18 1411)     Initial Impression / Assessment and Plan / ED Course  I have reviewed the triage vital signs and the nursing notes.  Pertinent labs & imaging results that were available during my care of the patient were reviewed by me and considered in my medical decision making (see chart for details).     Patient seen and examined.  Medications ordered.    Vital signs reviewed and are as follows: BP (!) 135/91   Pulse 78   Temp 98.6 F (37 C) (Oral)   Resp 20   Ht 5\' 8"  (1.727 m)   Wt 99.7 kg   SpO2 97%   BMI 33.44 kg/m   Discussed with patient that I will treat his pain here with p.o. Percocet and IM Toradol, however given that he has chronic pain,  do not feel that we can appropriately treat him with narcotic pain medications here.  He request that I give him a prescription to last him for the next couple of weeks, however I declined.  I encouraged patient to continue working with his primary care doctor and orthopedist.   Final Clinical Impressions(s) / ED Diagnoses   Final diagnoses:  Chronic left hip pain   Patient with chronic left hip pain.  No neurovascular compromise to left lower extremity apparent on exam today.  Tramadol has not been helping his pain.  Discussed the difficulties of managing chronic pain through the emergency department and I will be unable to give him a prescription for chronic narcotics today.  I do believe he is in pain and I gave a dose of medication here to help with acute pain.  No signs of infection.  Patient is able to range the hip well and I do not suspect septic arthritis.  No signs of lower extremity DVT.  I do not suspect radicular pain at this time.  ED Discharge Orders    None       Renne Crigler, PA-C 03/09/18 1504    Vanetta Mulders, MD 03/10/18 [phone]

## 2018-03-09 NOTE — ED Triage Notes (Addendum)
C/o chronic left hip pain-reports hip replacement 1.5 years ago-states pain meds that were given here and other EDs in the last week are not relieving his pain-NAD-steady gait with own cane

## 2018-03-20 ENCOUNTER — Emergency Department (HOSPITAL_BASED_OUTPATIENT_CLINIC_OR_DEPARTMENT_OTHER)
Admission: EM | Admit: 2018-03-20 | Discharge: 2018-03-20 | Disposition: A | Discharge status: Home / Self Care | Payer: No Typology Code available for payment source | Attending: Emergency Medicine | Admitting: Emergency Medicine

## 2018-03-20 ENCOUNTER — Other Ambulatory Visit: Payer: Self-pay

## 2018-03-20 ENCOUNTER — Encounter (HOSPITAL_BASED_OUTPATIENT_CLINIC_OR_DEPARTMENT_OTHER): Payer: Self-pay

## 2018-03-20 DIAGNOSIS — I10 Essential (primary) hypertension: Secondary | ICD-10-CM | POA: Diagnosis not present

## 2018-03-20 DIAGNOSIS — F419 Anxiety disorder, unspecified: Secondary | ICD-10-CM | POA: Insufficient documentation

## 2018-03-20 DIAGNOSIS — M25552 Pain in left hip: Secondary | ICD-10-CM | POA: Insufficient documentation

## 2018-03-20 DIAGNOSIS — F17200 Nicotine dependence, unspecified, uncomplicated: Secondary | ICD-10-CM | POA: Insufficient documentation

## 2018-03-20 DIAGNOSIS — Z79899 Other long term (current) drug therapy: Secondary | ICD-10-CM | POA: Insufficient documentation

## 2018-03-20 DIAGNOSIS — Z23 Encounter for immunization: Secondary | ICD-10-CM | POA: Insufficient documentation

## 2018-03-20 DIAGNOSIS — G8929 Other chronic pain: Secondary | ICD-10-CM | POA: Insufficient documentation

## 2018-03-20 DIAGNOSIS — Z950 Presence of cardiac pacemaker: Secondary | ICD-10-CM | POA: Diagnosis not present

## 2018-03-20 MED ORDER — TETANUS-DIPHTH-ACELL PERTUSSIS 5-2.5-18.5 LF-MCG/0.5 IM SUSP
0.5000 mL | Freq: Once | INTRAMUSCULAR | Status: AC
  Administered 2018-03-20: 0.5 mL via INTRAMUSCULAR
  Filled 2018-03-20: qty 0.5

## 2018-03-20 MED ORDER — OXYCODONE-ACETAMINOPHEN 5-325 MG PO TABS
1.0000 | ORAL_TABLET | Freq: Once | ORAL | Status: AC
  Administered 2018-03-20: 1 via ORAL
  Filled 2018-03-20: qty 1

## 2018-03-20 NOTE — ED Triage Notes (Signed)
Per EMS patient has had bilateral hip replacement.  Currently c/o severe pain in left hip.  Laceration to left eye due to fall.

## 2018-03-20 NOTE — ED Provider Notes (Signed)
[facility] EMERGENCY DEPARTMENT Provider Note   CSN: [id number] Arrival date & time: 03/20/18  1456     History   Chief Complaint Chief Complaint  Patient presents with  . Fall    HPI Lanier Millon is a 47 y.o. male.  The history is provided by the patient and medical records. No language interpreter was used.  Fall  Pertinent negatives include no abdominal pain.   Isahia Hollerbach is a 47 y.o. male  with a PMH of chronic hip pain who presents to the Emergency Department complaining of persistent left hip pain which has been worse over the last several days.  He is followed by his surgeon and actually has an appointment tomorrow for further imaging.  He states that today, his leg just "gave out" and he fell.  He did sustain a very minor abrasion to the forehead from hitting a lamp during fall.  No loss of consciousness, nausea or vomiting.  Unsure of last tetanus status.  No numbness or tingling.  No fever or chills.  Past Medical History:  Diagnosis Date  . Hypertension   . Kidney stones   . Left ventricular noncompaction cardiomyopathy [facility])     Patient Active Problem List   Diagnosis Date Noted  . Seborrheic keratosis 09/10/2010  . Cardiomyopathy 08/13/2010  . 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 Hypertension 08/13/2010  . Other and unspecified hyperlipidemia 08/13/2010  . Anxiety 08/13/2010  . Ankle pain 08/13/2010    Past Surgical History:  Procedure Laterality Date  . CARDIAC DEFIBRILLATOR PLACEMENT     x 2   . CARDIAC PACEMAKER PLACEMENT     x 2   . HIP SURGERY    . left knee surgery  2007  . PACEMAKER REVISION    . SHOULDER SURGERY    . skin grafts  2006   x 2         Home Medications    Prior to Admission medications   Medication Sig Start Date End Date Taking? Authorizing Provider  carvedilol (COREG) 25 MG tablet Take 25 mg by mouth 2 (two) times daily with a meal.      [provider]    clonazePAM (KLONOPIN) 0.5 MG tablet Take by mouth.    [provider]  lisinopril (PRINIVIL,ZESTRIL) 5 MG tablet Take 5 mg by mouth daily.      [provider]  Multiple Vitamin (MULTIVITAMIN) tablet Take 1 tablet by mouth daily.      [provider]  nitroGLYCERIN (NITROSTAT) 0.4 MG SL tablet Place 0.4 mg under the tongue every 5 (five) minutes as needed.      [provider]  pantoprazole (PROTONIX) 40 MG tablet Take 40 mg by mouth daily.      [provider]  PARoxetine (PAXIL) 20 MG tablet Take by mouth.    [provider]  potassium chloride SA (K-DUR,KLOR-CON) 20 MEQ tablet Take 1 tablet (20 mEq total) by mouth 2 (two) times daily for 4 days. 12/21/17 12/25/17  Long, Arlyss Repress, MD  Probiotic Product (PROBIOTIC ACIDOPHILUS PO) Take by mouth.      [provider]  simvastatin (ZOCOR) 20 MG tablet Take 20 mg by mouth at bedtime.      [provider]    Family History Family History  Problem Relation Age of Onset  . Bipolar disorder Brother   . Diabetes type II Father     Social History Social History  Tobacco Use  . Smoking status: Current Every Day Smoker  . Smokeless tobacco: Current User    Types: Snuff  Substance Use Topics  . Alcohol use: Yes    Frequency: Never  . Drug use: No     Allergies   Patient has no known allergies.   Review of Systems Review of Systems  Constitutional: Negative for chills and fever.  Gastrointestinal: Negative for abdominal pain.  Musculoskeletal: Positive for arthralgias and myalgias. Negative for back pain.  Skin: Negative for color change and wound.     Physical Exam Updated Vital Signs BP 127/89 (BP Location: Right Arm)   Pulse 78   Resp 20   Ht 5\' 8"  (1.727 m)   Wt 100.7 kg   SpO2 94%   BMI 33.75 kg/m   Physical Exam  Constitutional: He is oriented to person, place, and time. He appears well-developed and well-nourished. No distress.  HENT:  Head:  Normocephalic and atraumatic.  Cardiovascular: Normal rate, regular rhythm and normal heart sounds.  No murmur heard. Pulmonary/Chest: Effort normal and breath sounds normal. No respiratory distress.  Abdominal: Soft. He exhibits no distension. There is no tenderness.  Musculoskeletal:  Mild tenderness to the left lateral hip.  Surgical incision noted.  No erythema or warmth.  Good range of motion.  Neurological: He is alert and oriented to person, place, and time.  Bilateral lower extremities neurovascularly intact. CN 2-12 grossly intact.   Skin: Skin is warm and dry.  Nursing note and vitals reviewed.    ED Treatments / Results  Labs (all labs ordered are listed, but only abnormal results are displayed) Labs Reviewed - No data to display  EKG None  Radiology No results found.  Procedures Procedures (including critical care time)  Medications Ordered in ED Medications  Tdap (BOOSTRIX) injection 0.5 mL (has no administration in time range)  oxyCODONE-acetaminophen (PERCOCET/ROXICET) 5-325 MG per tablet 1 tablet (has no administration in time range)     Initial Impression / Assessment and Plan / ED Course  I have reviewed the triage vital signs and the nursing notes.  Pertinent labs & imaging results that were available during my care of the patient were reviewed by me and considered in my medical decision making (see chart for details).     Shayla Donnel is a 47 y.o. male who presents to ED for acute worsening of his chronic hip pain.  No signs of septic joint.  Lower extremities are neurovascularly intact.  He has an appointment with his surgeon tomorrow.  He is asking for pain medication to get him through until he can see his doctor.  Gave him a Percocet in the ER and he can follow-up for further pain management. Evaluation does not show pathology that would require ongoing emergent intervention or inpatient treatment. Reasons to return to ER discussed and all questions  answered.     Final Clinical Impressions(s) / ED Diagnoses   Final diagnoses:  Chronic left hip pain    ED Discharge Orders    None       Azaria Bartell, Chase Picket, PA-C 03/20/18 1537    Virgina Norfolk, DO 03/21/18 0013

## 2018-03-20 NOTE — Discharge Instructions (Signed)
Your surgeon at your scheduled appointment tomorrow. Return to ER for new or worsening symptoms, any additional concerns.

## 2018-04-15 ENCOUNTER — Encounter (HOSPITAL_COMMUNITY): Payer: Self-pay | Admitting: Emergency Medicine

## 2018-04-15 ENCOUNTER — Emergency Department (HOSPITAL_COMMUNITY)
Admission: EM | Admit: 2018-04-15 | Discharge: 2018-04-16 | Disposition: A | Discharge status: Home / Self Care | Payer: Non-veteran care | Attending: Emergency Medicine | Admitting: Emergency Medicine

## 2018-04-15 DIAGNOSIS — G8929 Other chronic pain: Secondary | ICD-10-CM | POA: Insufficient documentation

## 2018-04-15 DIAGNOSIS — Z96642 Presence of left artificial hip joint: Secondary | ICD-10-CM | POA: Diagnosis not present

## 2018-04-15 DIAGNOSIS — Z79899 Other long term (current) drug therapy: Secondary | ICD-10-CM | POA: Insufficient documentation

## 2018-04-15 DIAGNOSIS — M25552 Pain in left hip: Secondary | ICD-10-CM

## 2018-04-15 DIAGNOSIS — I10 Essential (primary) hypertension: Secondary | ICD-10-CM | POA: Insufficient documentation

## 2018-04-15 DIAGNOSIS — Z95811 Presence of heart assist device: Secondary | ICD-10-CM | POA: Diagnosis not present

## 2018-04-15 DIAGNOSIS — Z95810 Presence of automatic (implantable) cardiac defibrillator: Secondary | ICD-10-CM | POA: Insufficient documentation

## 2018-04-15 DIAGNOSIS — F17210 Nicotine dependence, cigarettes, uncomplicated: Secondary | ICD-10-CM | POA: Insufficient documentation

## 2018-04-15 MED ORDER — MORPHINE SULFATE (PF) 4 MG/ML IV SOLN
4.0000 mg | Freq: Once | INTRAVENOUS | Status: AC
  Administered 2018-04-15: 4 mg via INTRAMUSCULAR
  Filled 2018-04-15: qty 1

## 2018-04-15 MED ORDER — HYDROCODONE-ACETAMINOPHEN 5-325 MG PO TABS: 1.0000 | ORAL_TABLET | Freq: Four times a day (QID) | ORAL | 0 refills | Status: DC | PRN

## 2018-04-15 NOTE — ED Provider Notes (Signed)
[facility] EMERGENCY DEPARTMENT Provider Note   CSN: [id number] Arrival date & time: 04/15/18  2235     History   Chief Complaint Chief Complaint  Patient presents with  . Hip Pain    HPI Terry Mcconnell is a 47 y.o. male.   47 year old male presents to the emergency department for evaluation of left hip pain.  He has a history of chronic hip pain for which she is followed by orthopedics.  Has had a left hip replacement in the past, but states that he needs a new hip replacement in the future.  Has scheduled follow-up with his orthopedist tomorrow (Monday).  States that he has been using a home hydrocodone sparingly.  Last took this at 1700 today for pain control.  Describes a sharp pain in his groin, intermittently causing his left leg to give out.  He has been ambulating with a cane.  He does have a walker at home, but neglects to use this.  Denies any bowel or bladder incontinence, extremity numbness or paresthesia changes.  Presenting for acute pain control.  Seen at [facility] for same on 04/12/18.     Past Medical History:  Diagnosis Date  . Hypertension   . Kidney stones   . Left ventricular noncompaction cardiomyopathy [facility])     Patient Active Problem List   Diagnosis Date Noted  . Seborrheic keratosis 09/10/2010  . Cardiomyopathy 08/13/2010  . 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 Hypertension 08/13/2010  . Other and unspecified hyperlipidemia 08/13/2010  . Anxiety 08/13/2010  . Ankle pain 08/13/2010    Past Surgical History:  Procedure Laterality Date  . CARDIAC DEFIBRILLATOR PLACEMENT     x 2   . CARDIAC PACEMAKER PLACEMENT     x 2   . HIP SURGERY    . left knee surgery  2007  . PACEMAKER REVISION    . SHOULDER SURGERY    . skin grafts  2006   x 2         Home Medications    Prior to Admission medications   Medication Sig Start Date End Date Taking? Authorizing Provider  carvedilol (COREG) 25 MG  tablet Take 25 mg by mouth 2 (two) times daily with a meal.      [provider]  clonazePAM (KLONOPIN) 0.5 MG tablet Take by mouth.    [provider]  HYDROcodone-acetaminophen (NORCO/VICODIN) 5-325 MG tablet Take 1-2 tablets by mouth every 6 (six) hours as needed for severe pain. 04/15/18   Antony Madura, PA-C  lisinopril (PRINIVIL,ZESTRIL) 5 MG tablet Take 5 mg by mouth daily.      [provider]  Multiple Vitamin (MULTIVITAMIN) tablet Take 1 tablet by mouth daily.      [provider]  nitroGLYCERIN (NITROSTAT) 0.4 MG SL tablet Place 0.4 mg under the tongue every 5 (five) minutes as needed.      [provider]  pantoprazole (PROTONIX) 40 MG tablet Take 40 mg by mouth daily.      [provider]  PARoxetine (PAXIL) 20 MG tablet Take by mouth.    [provider]  potassium chloride SA (K-DUR,KLOR-CON) 20 MEQ tablet Take 1 tablet (20 mEq total) by mouth 2 (two) times daily for 4 days. 12/21/17 12/25/17  Long, Arlyss Repress, MD  Probiotic Product (PROBIOTIC ACIDOPHILUS PO) Take by mouth.      [provider]  simvastatin (ZOCOR) 20 MG tablet Take 20 mg by mouth at bedtime.  [provider]    Family History Family History  Problem Relation Age of Onset  . Bipolar disorder Brother   . Diabetes type II Father     Social History Social History   Tobacco Use  . Smoking status: Current Every Day Smoker  . Smokeless tobacco: Current User    Types: Snuff  Substance Use Topics  . Alcohol use: Yes    Frequency: Never  . Drug use: No     Allergies   Patient has no known allergies.   Review of Systems Review of Systems Ten systems reviewed and are negative for acute change, except as noted in the HPI.    Physical Exam Updated Vital Signs BP 102/66   Pulse 96   Temp 98.2 F (36.8 C) (Oral)   Resp 20   Ht 5\' 8"  (1.727 m)   Wt 101.2 kg   SpO2 90%   BMI 33.91 kg/m   Physical Exam  Constitutional:  He is oriented to person, place, and time. He appears well-developed and well-nourished. No distress.  Nontoxic appearing and in NAD  HENT:  Head: Normocephalic and atraumatic.  Eyes: Conjunctivae and EOM are normal. No scleral icterus.  Neck: Normal range of motion.  Cardiovascular: Normal rate, regular rhythm and intact distal pulses.  DP pulses 2+ in BLE  Pulmonary/Chest: Effort normal. No respiratory distress.  Respirations even and unlabored  Musculoskeletal: Normal range of motion.  Neurological: He is alert and oriented to person, place, and time. He exhibits normal muscle tone. Coordination normal.  GCS 15.  Moving all extremities spontaneously.  Sensation to light touch intact.  Skin: Skin is warm and dry. No rash noted. He is not diaphoretic. No erythema. No pallor.  Psychiatric: He has a normal mood and affect. His behavior is normal.  Nursing note and vitals reviewed.    ED Treatments / Results  Labs (all labs ordered are listed, but only abnormal results are displayed) Labs Reviewed - No data to display  EKG None  Radiology No results found.  Procedures Procedures (including critical care time)  Medications Ordered in ED Medications  morphine 4 MG/ML injection 4 mg (has no administration in time range)     Initial Impression / Assessment and Plan / ED Course  I have reviewed the triage vital signs and the nursing notes.  Pertinent labs & imaging results that were available during my care of the patient were reviewed by me and considered in my medical decision making (see chart for details).     Patient presents to the emergency department for evaluation of acute on chronic L hip pain. Patient neurovascularly intact on exam.  No red flags or signs concerning for cauda equina.  Compartments in the affected extremity are soft.  He was given 1 IM dose of pain medicine in the ED.  Will discharge with #10 tablets of hydrocodone; substance database reviewed with  last prescription given for #14 tablets on 03/23/18.  Infrequent Rxs for narcotics; does get Klonopin prescribed monthly.  Encouraged Orthopedic follow up tomorrow as scheduled.  Return precautions discussed and provided.  Patient discharged in stable condition with no unaddressed concerns.   Final Clinical Impressions(s) / ED Diagnoses   Final diagnoses:  Chronic left hip pain    ED Discharge Orders         Ordered    HYDROcodone-acetaminophen (NORCO/VICODIN) 5-325 MG tablet  Every 6 hours PRN     04/15/18 2340  Antony Madura, PA-C 04/15/18 2354    Shaune Pollack, MD 04/17/18 [phone]

## 2018-04-15 NOTE — Discharge Instructions (Signed)
You take Norco as prescribed for management of severe pain.  Take with caution when using your clonazepam.  Do not drive or drink alcohol after taking Norco as it may make you drowsy and impair your judgment.  Follow up with your Orthopedist as scheduled.

## 2018-04-15 NOTE — ED Triage Notes (Addendum)
Brought by ems from home (lives in [place]).  Requested to come here because he says [facility] doesn't give pain meds.  C/O left hip pain which is chronic.  Is scheduled to have a repeat surgery to hip in a few weeks.  Took his last vicodin at 5pm.  Patient also reports falling every day.  Walking with a cane.  Requesting IV morphine in triage.

## 2018-05-12 ENCOUNTER — Encounter (HOSPITAL_BASED_OUTPATIENT_CLINIC_OR_DEPARTMENT_OTHER): Payer: Self-pay | Admitting: *Deleted

## 2018-05-12 ENCOUNTER — Emergency Department (HOSPITAL_BASED_OUTPATIENT_CLINIC_OR_DEPARTMENT_OTHER)
Admission: EM | Admit: 2018-05-12 | Discharge: 2018-05-12 | Disposition: A | Discharge status: Home / Self Care | Payer: Non-veteran care | Attending: Emergency Medicine | Admitting: Emergency Medicine

## 2018-05-12 ENCOUNTER — Other Ambulatory Visit: Payer: Self-pay

## 2018-05-12 DIAGNOSIS — M25552 Pain in left hip: Secondary | ICD-10-CM | POA: Diagnosis not present

## 2018-05-12 DIAGNOSIS — Z79899 Other long term (current) drug therapy: Secondary | ICD-10-CM | POA: Diagnosis not present

## 2018-05-12 DIAGNOSIS — I10 Essential (primary) hypertension: Secondary | ICD-10-CM | POA: Diagnosis not present

## 2018-05-12 DIAGNOSIS — G8929 Other chronic pain: Secondary | ICD-10-CM | POA: Insufficient documentation

## 2018-05-12 DIAGNOSIS — F17200 Nicotine dependence, unspecified, uncomplicated: Secondary | ICD-10-CM | POA: Diagnosis not present

## 2018-05-12 NOTE — ED Provider Notes (Signed)
[facility] EMERGENCY DEPARTMENT Provider Note   CSN: [id number] Arrival date & time: 05/12/18  1438     History   Chief Complaint Chief Complaint  Patient presents with  . Hip Pain    HPI Terry Mcconnell is a 47 y.o. male.  HPI   47 year old male with chronic hip pain.  Ongoing.  States that is been worse in the past several weeks.  Denies any acute trauma or strain.  He states that he is awaiting a hip replacement.  He is followed by an orthopedist.  He is requesting pain medicine.  He states that he was referred to pain management but has not yet to establish with them yet.  Past Medical History:  Diagnosis Date  . Hypertension   . Kidney stones   . Left ventricular noncompaction cardiomyopathy [facility](HCC)     Patient Active Problem List   Diagnosis Date Noted  . Seborrheic keratosis 09/10/2010  . Cardiomyopathy 08/13/2010  . 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 Hypertension 08/13/2010  . Other and unspecified hyperlipidemia 08/13/2010  . Anxiety 08/13/2010  . Ankle pain 08/13/2010    Past Surgical History:  Procedure Laterality Date  . CARDIAC DEFIBRILLATOR PLACEMENT     x 2   . CARDIAC PACEMAKER PLACEMENT     x 2   . HIP SURGERY    . left knee surgery  2007  . PACEMAKER REVISION    . SHOULDER SURGERY    . skin grafts  2006   x 2         Home Medications    Prior to Admission medications   Medication Sig Start Date End Date Taking? Authorizing Provider  carvedilol (COREG) 25 MG tablet Take 25 mg by mouth 2 (two) times daily with a meal.     Yes [provider]  clonazePAM (KLONOPIN) 0.5 MG tablet Take by mouth.   Yes [provider]  lisinopril (PRINIVIL,ZESTRIL) 5 MG tablet Take 5 mg by mouth daily.     Yes [provider]  MELOXICAM PO Take by mouth.   Yes [provider]  Multiple Vitamin (MULTIVITAMIN) tablet Take 1 tablet by mouth daily.     Yes [provider]    PARoxetine (PAXIL) 20 MG tablet Take by mouth.   Yes [provider]  HYDROcodone-acetaminophen (NORCO/VICODIN) 5-325 MG tablet Take 1-2 tablets by mouth every 6 (six) hours as needed for severe pain. 04/15/18   Antony MaduraHumes, Kelly, PA-C  nitroGLYCERIN (NITROSTAT) 0.4 MG SL tablet Place 0.4 mg under the tongue every 5 (five) minutes as needed.      [provider]  pantoprazole (PROTONIX) 40 MG tablet Take 40 mg by mouth daily.      [provider]  potassium chloride SA (K-DUR,KLOR-CON) 20 MEQ tablet Take 1 tablet (20 mEq total) by mouth 2 (two) times daily for 4 days. 12/21/17 12/25/17  Long, Arlyss RepressJoshua G, MD  Probiotic Product (PROBIOTIC ACIDOPHILUS PO) Take by mouth.      [provider]  simvastatin (ZOCOR) 20 MG tablet Take 20 mg by mouth at bedtime.      [provider]    Family History Family History  Problem Relation Age of Onset  . Bipolar disorder Brother   . Diabetes type II Father     Social History Social History   Tobacco Use  . Smoking status: Current Every Day Smoker  . Smokeless tobacco: Current User    Types: Snuff  Substance Use Topics  . Alcohol use: Yes    Frequency: Never  . Drug use: No     Allergies   Patient has no known allergies.   Review of Systems Review of Systems  All systems reviewed and negative, other than as noted in HPI.  Physical Exam Updated Vital Signs BP (!) 129/91 (BP Location: Right Arm)   Pulse 88   Temp 98.4 F (36.9 C) (Oral)   Resp 16   Ht 5\' 8"  (1.727 m)   Wt 101.1 kg   SpO2 99%   BMI 33.89 kg/m   Physical Exam Vitals signs and nursing note reviewed.  Constitutional:      General: He is not in acute distress.    Appearance: He is well-developed.  HENT:     Head: Normocephalic and atraumatic.  Eyes:     General:        Right eye: No discharge.        Left eye: No discharge.     Conjunctiva/sclera: Conjunctivae normal.  Neck:     Musculoskeletal: Neck supple.   Cardiovascular:     Rate and Rhythm: Normal rate and regular rhythm.     Heart sounds: Normal heart sounds. No murmur. No friction rub. No gallop.   Pulmonary:     Effort: Pulmonary effort is normal. No respiratory distress.     Breath sounds: Normal breath sounds.  Abdominal:     General: There is no distension.     Palpations: Abdomen is soft.     Tenderness: There is no abdominal tenderness.  Musculoskeletal:     Comments: Pain with attempted range of motion both passively and actively left hip.  Neurovascular intact.  Skin:    General: Skin is warm and dry.  Neurological:     Mental Status: He is alert.  Psychiatric:        Behavior: Behavior normal.        Thought Content: Thought content normal.      ED Treatments / Results  Labs (all labs ordered are listed, but only abnormal results are displayed) Labs Reviewed - No data to display  EKG None  Radiology No results found.  Procedures Procedures (including critical care time)  Medications Ordered in ED Medications - No data to display   Initial Impression / Assessment and Plan / ED Course  I have reviewed the triage vital signs and the nursing notes.  Pertinent labs & imaging results that were available during my care of the patient were reviewed by me and considered in my medical decision making (see chart for details).    47yM with chronic hip pain. Note from Dr Kathi Der, orthopedic surgery, office from today stating that they would not prescribe him additional pain medication. This is his third ED visit in the past month for the same.  Note from [facility] ED visit on 04/12/18 stating " I had to explain to the patient several times that I could not write him narcotic pain medicines for a chronic problem such as his chronic hip pain." I had a similar conversation with him myself. I tried to discuss alternative treatment options but put was upset at this point,disconnected himself from the monitor and walked out.     Final Clinical Impressions(s) / ED Diagnoses   Final diagnoses:  Chronic left hip pain    ED Discharge Orders    None       Raeford Razor, MD 05/21/18 [phone]

## 2018-05-12 NOTE — ED Notes (Signed)
Unable to assess pt.  Patient left after EDP saw him.

## 2018-05-12 NOTE — ED Triage Notes (Signed)
Left hip pain. States he has a hx of hip surgery and needs another surgery but needs pain medication until he can get the surgery. He is ambulatory.

## 2018-05-16 ENCOUNTER — Emergency Department (HOSPITAL_BASED_OUTPATIENT_CLINIC_OR_DEPARTMENT_OTHER)
Admission: EM | Admit: 2018-05-16 | Discharge: 2018-05-16 | Disposition: A | Discharge status: Home / Self Care | Payer: No Typology Code available for payment source | Attending: Emergency Medicine | Admitting: Emergency Medicine

## 2018-05-16 ENCOUNTER — Encounter (HOSPITAL_BASED_OUTPATIENT_CLINIC_OR_DEPARTMENT_OTHER): Payer: Self-pay

## 2018-05-16 ENCOUNTER — Other Ambulatory Visit: Payer: Self-pay

## 2018-05-16 DIAGNOSIS — Z950 Presence of cardiac pacemaker: Secondary | ICD-10-CM | POA: Diagnosis not present

## 2018-05-16 DIAGNOSIS — M25552 Pain in left hip: Secondary | ICD-10-CM | POA: Diagnosis not present

## 2018-05-16 DIAGNOSIS — F17200 Nicotine dependence, unspecified, uncomplicated: Secondary | ICD-10-CM | POA: Diagnosis not present

## 2018-05-16 DIAGNOSIS — Z79899 Other long term (current) drug therapy: Secondary | ICD-10-CM | POA: Diagnosis not present

## 2018-05-16 DIAGNOSIS — F419 Anxiety disorder, unspecified: Secondary | ICD-10-CM | POA: Insufficient documentation

## 2018-05-16 DIAGNOSIS — I10 Essential (primary) hypertension: Secondary | ICD-10-CM | POA: Insufficient documentation

## 2018-05-16 MED ORDER — METHOCARBAMOL 500 MG PO TABS: 500.0000 mg | ORAL_TABLET | Freq: Two times a day (BID) | ORAL | 0 refills | Status: DC

## 2018-05-16 MED ORDER — HYDROMORPHONE HCL 1 MG/ML IJ SOLN
2.0000 mg | Freq: Once | INTRAMUSCULAR | Status: AC
  Administered 2018-05-16: 2 mg via INTRAMUSCULAR
  Filled 2018-05-16: qty 2

## 2018-05-16 NOTE — ED Notes (Signed)
ED Provider at bedside. 

## 2018-05-16 NOTE — ED Triage Notes (Signed)
Pt states he needs a left hip replacement-was seen at the [place] 2 days ago-pt also seen at [facility] ED 12/27 for hip pain-pt he is taking hydrocodone-pt states he is "hurting all over", bilat leg weakness-NAD- to triage in w/c

## 2018-05-16 NOTE — Discharge Instructions (Signed)
°  Muscle relaxer: Robaxin is a muscle relaxer and may help loosen stiff muscles. Do not take the Robaxin while driving or performing other dangerous activities.  Follow up: Follow up with a primary care provider for any future management of these complaints. Be sure to follow up within 7-10 days.  For prescription assistance, may try using prescription discount sites or apps, such as goodrx.com

## 2018-05-16 NOTE — ED Provider Notes (Signed)
[facility] EMERGENCY DEPARTMENT Provider Note   CSN: [id number] Arrival date & time: 05/16/18  1318     History   Chief Complaint Chief Complaint  Patient presents with  . Hip Pain    HPI Calloway Liszka is a 47 y.o. male.  HPI   Kassen Fine is a 47 y.o. male, with a history of hypertension and chronic left hip pain, presenting to the ED with chronic left hip pain. He states he is scheduled for a repeat left hip replacement in February, but he has had persistent and worsening pain over the past several weeks.  He does not want evaluation for worsening hip pain as he states he knows what the issue is.   He states he was prescribed Vicodin yesterday, but was told he could not be prescribed any more narcotics from his PCPs office until his PCP returns to the office January 6 and the Vicodin is not controlling his pain.  He has been told that the orthopedic surgeons at [facility] where he is scheduled to have his hip replacement will not prescribe narcotics until after the surgery. He is currently taking Mobic and has previously tried multiple other NSAIDs, including PO and topical diclofenac. He is complaining of sharp and cramping pain as well as intermittent muscle tightness and spasms, severe, radiating into the lower back and the left upper leg.  Denies numbness, weakness, falls/trauma, abdominal pain, fever, nausea/vomiting, changes in bowel or bladder function, or any other complaints.   Past Medical History:  Diagnosis Date  . Hypertension   . Kidney stones   . 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 Seborrheic keratosis 09/10/2010  . Cardiomyopathy 08/13/2010  . 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 Hypertension 08/13/2010  . Other and unspecified hyperlipidemia 08/13/2010  . Anxiety 08/13/2010  . Ankle pain 08/13/2010    Past Surgical History:    Procedure Laterality Date  . CARDIAC DEFIBRILLATOR PLACEMENT     x 2   . CARDIAC PACEMAKER PLACEMENT     x 2   . HIP SURGERY    . left knee surgery  2007  . PACEMAKER REVISION    . SHOULDER SURGERY    . skin grafts  2006   x 2         Home Medications    Prior to Admission medications   Medication Sig Start Date End Date Taking? Authorizing Provider  carvedilol (COREG) 25 MG tablet Take 25 mg by mouth 2 (two) times daily with a meal.      [provider]  clonazePAM (KLONOPIN) 0.5 MG tablet Take by mouth.    [provider]  HYDROcodone-acetaminophen (NORCO/VICODIN) 5-325 MG tablet Take 1-2 tablets by mouth every 6 (six) hours as needed for severe pain. 04/15/18   Antony Madura, PA-C  lisinopril (PRINIVIL,ZESTRIL) 5 MG tablet Take 5 mg by mouth daily.      [provider]  MELOXICAM PO Take by mouth.    [provider]  methocarbamol (ROBAXIN) 500 MG tablet Take 1 tablet (500 mg total) by mouth 2 (two) times daily. 05/16/18   Ambrea Hegler C, PA-C  Multiple Vitamin (MULTIVITAMIN) tablet Take 1 tablet by mouth daily.      [provider]  nitroGLYCERIN (NITROSTAT) 0.4 MG SL tablet Place 0.4 mg under the tongue every 5 (five) minutes as needed.      [provider]  PARoxetine (PAXIL) 20 MG tablet Take 30 mg by mouth.     [provider]  potassium chloride SA (K-DUR,KLOR-CON) 20 MEQ tablet Take 1 tablet (20 mEq total) by mouth 2 (two) times daily for 4 days. 12/21/17 12/25/17  Long, Arlyss RepressJoshua G, MD    Family History Family History  Problem Relation Age of Onset  . Bipolar disorder Brother   . Diabetes type II Father     Social History Social History   Tobacco Use  . Smoking status: Current Every Day Smoker  . Smokeless tobacco: Current User    Types: Snuff  Substance Use Topics  . Alcohol use: Yes    Frequency: Never  . Drug use: No     Allergies   Patient has no known allergies.   Review of Systems Review  of Systems  Constitutional: Negative for fever.  Gastrointestinal: Negative for abdominal pain, blood in stool, diarrhea, nausea and vomiting.  Genitourinary: Negative for difficulty urinating, dysuria and testicular pain.  Musculoskeletal: Positive for arthralgias and back pain.  Neurological: Negative for weakness and numbness.  All other systems reviewed and are negative.    Physical Exam Updated Vital Signs BP 140/61 (BP Location: Right Arm)   Pulse 93   Temp 98.2 F (36.8 C) (Oral)   Resp 18   Ht 5\' 8"  (1.727 m)   Wt 104.3 kg   SpO2 98%   BMI 34.97 kg/m   Physical Exam Vitals signs and nursing note reviewed.  Constitutional:      General: He is not in acute distress.    Appearance: He is well-developed. He is not diaphoretic.  HENT:     Head: Normocephalic and atraumatic.  Eyes:     Conjunctiva/sclera: Conjunctivae normal.  Neck:     Musculoskeletal: Neck supple.  Cardiovascular:     Rate and Rhythm: Normal rate and regular rhythm.     Pulses:          Posterior tibial pulses are 2+ on the right side and 2+ on the left side.  Pulmonary:     Effort: Pulmonary effort is normal. No respiratory distress.  Abdominal:     Palpations: Abdomen is soft.     Tenderness: There is no abdominal tenderness. There is no guarding.  Lymphadenopathy:     Cervical: No cervical adenopathy.  Skin:    General: Skin is warm and dry.  Neurological:     Mental Status: He is alert.     Comments: Sensation grossly intact to light touch in the lower extremities bilaterally. No saddle anesthesias. Strength 5/5 in the bilateral lower extremities. Antalgic gait without any noted instability.  Psychiatric:        Behavior: Behavior normal.      ED Treatments / Results  Labs (all labs ordered are listed, but only abnormal results are displayed) Labs Reviewed - No data to display  EKG None  Radiology No results found.  Procedures Procedures (including critical care  time)  Medications Ordered in ED Medications  HYDROmorphone (DILAUDID) injection 2 mg (2 mg Intramuscular Given 05/16/18 1630)     Initial Impression / Assessment and Plan / ED Course  I have reviewed the triage vital signs and the nursing notes.  Pertinent labs & imaging results that were available during my care of the patient were reviewed by me and considered in my medical decision making (see chart for details).     Patient presents with chronic left hip pain.  He is requesting "  something stronger than hydrocodone for pain." Review of the narcotic database shows patient has received narcotic prescriptions on December 28, 29, and 30, all from different providers in different offices/locations.  I definitively told the patient that we could offer a narcotic injection here in the ED, but would not prescribe narcotics for him today.  After a polite problem-solving discussion, we settled upon a prescription for a muscle relaxer.   Final Clinical Impressions(s) / ED Diagnoses   Final diagnoses:  Left hip pain    ED Discharge Orders         Ordered    methocarbamol (ROBAXIN) 500 MG tablet  2 times daily     05/16/18 1633           Anselm Pancoast, PA-C 05/16/18 1645    Gwyneth Sprout, MD 05/18/18 2132

## 2018-06-05 ENCOUNTER — Emergency Department (HOSPITAL_BASED_OUTPATIENT_CLINIC_OR_DEPARTMENT_OTHER): Payer: No Typology Code available for payment source

## 2018-06-05 ENCOUNTER — Encounter (HOSPITAL_BASED_OUTPATIENT_CLINIC_OR_DEPARTMENT_OTHER): Payer: Self-pay | Admitting: Emergency Medicine

## 2018-06-05 ENCOUNTER — Emergency Department (HOSPITAL_BASED_OUTPATIENT_CLINIC_OR_DEPARTMENT_OTHER)
Admission: EM | Admit: 2018-06-05 | Discharge: 2018-06-05 | Disposition: A | Discharge status: Home / Self Care | Payer: No Typology Code available for payment source | Attending: Emergency Medicine | Admitting: Emergency Medicine

## 2018-06-05 ENCOUNTER — Other Ambulatory Visit: Payer: Self-pay

## 2018-06-05 DIAGNOSIS — R002 Palpitations: Secondary | ICD-10-CM | POA: Diagnosis present

## 2018-06-05 DIAGNOSIS — F17200 Nicotine dependence, unspecified, uncomplicated: Secondary | ICD-10-CM | POA: Diagnosis not present

## 2018-06-05 DIAGNOSIS — Z79899 Other long term (current) drug therapy: Secondary | ICD-10-CM | POA: Diagnosis not present

## 2018-06-05 DIAGNOSIS — Z950 Presence of cardiac pacemaker: Secondary | ICD-10-CM | POA: Insufficient documentation

## 2018-06-05 DIAGNOSIS — F17228 Nicotine dependence, chewing tobacco, with other nicotine-induced disorders: Secondary | ICD-10-CM | POA: Insufficient documentation

## 2018-06-05 DIAGNOSIS — I10 Essential (primary) hypertension: Secondary | ICD-10-CM | POA: Insufficient documentation

## 2018-06-05 LAB — BASIC METABOLIC PANEL
ANION GAP: 9 (ref 5–15)
BUN: 21 mg/dL — ABNORMAL HIGH (ref 6–20)
CO2: 25 mmol/L (ref 22–32)
Calcium: 9.9 mg/dL (ref 8.9–10.3)
Chloride: 102 mmol/L (ref 98–111)
Creatinine, Ser: 0.95 mg/dL (ref 0.61–1.24)
GFR calc non Af Amer: >60 mL/min (ref >60)
Glucose, Bld: 119 mg/dL — ABNORMAL HIGH (ref 70–99)
Potassium: 4.3 mmol/L (ref 3.5–5.1)
Sodium: 136 mmol/L (ref 135–145)

## 2018-06-05 LAB — CBC
HCT: 45.4 % (ref 39.0–52.0)
Hemoglobin: 14.8 g/dL (ref 13.0–17.0)
MCH: 28.8 pg (ref 26.0–34.0)
MCHC: 32.6 g/dL (ref 30.0–36.0)
MCV: 88.3 fL (ref 80.0–100.0)
NRBC: 0 % (ref 0.0–0.2)
Platelets: 336 10*3/uL (ref 150–400)
RBC: 5.14 MIL/uL (ref 4.22–5.81)
RDW: 12.2 % (ref 11.5–15.5)
WBC: 12 10*3/uL — ABNORMAL HIGH (ref 4.0–10.5)

## 2018-06-05 LAB — D-DIMER, QUANTITATIVE: D-Dimer, Quant: 0.45 ug/mL-FEU (ref 0.00–0.50)

## 2018-06-05 LAB — TROPONIN I: Troponin I: <0.03 ng/mL (ref <0.03)

## 2018-06-05 MED ORDER — SODIUM CHLORIDE 0.9% FLUSH
3.0000 mL | Freq: Once | INTRAVENOUS | Status: AC
  Administered 2018-06-05: 3 mL via INTRAVENOUS
  Filled 2018-06-05: qty 3

## 2018-06-05 NOTE — ED Notes (Signed)
ED Provider at bedside. 

## 2018-06-05 NOTE — ED Triage Notes (Signed)
Pt states he is having hard and often heart palpitations. States they have been going on for 5-6 hours. Pt states he had some sharp center chest pain. States the pain abated. Pt has a medtronic pacer/defib.

## 2018-06-05 NOTE — ED Provider Notes (Signed)
[facility] EMERGENCY DEPARTMENT Provider Note   CSN: [id number] Arrival date & time: 06/05/18  0443     History   Chief Complaint Chief Complaint  Patient presents with  . Palpitations    HPI Terry Mcconnell is a 48 y.o. male.  The history is provided by the patient.  Palpitations  Palpitations quality: hard. Onset quality:  Sudden Duration:  5 hours Timing:  Intermittent Progression:  Unchanged Chronicity:  Recurrent Context: not anxiety and not caffeine   Relieved by:  Nothing Worsened by:  Nothing Ineffective treatments:  None tried Associated symptoms: no back pain, no chest pain, no chest pressure, no diaphoresis and no shortness of breath   Risk factors: no hx of DVT     Past Medical History:  Diagnosis Date  . Hypertension   . Kidney stones   . 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 Seborrheic keratosis 09/10/2010  . Cardiomyopathy 08/13/2010  . 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 Hypertension 08/13/2010  . Other and unspecified hyperlipidemia 08/13/2010  . Anxiety 08/13/2010  . Ankle pain 08/13/2010    Past Surgical History:  Procedure Laterality Date  . CARDIAC DEFIBRILLATOR PLACEMENT     x 2   . CARDIAC PACEMAKER PLACEMENT     x 2   . HIP SURGERY    . left knee surgery  2007  . PACEMAKER REVISION    . SHOULDER SURGERY    . skin grafts  2006   x 2         Home Medications    Prior to Admission medications   Medication Sig Start Date End Date Taking? Authorizing Provider  carvedilol (COREG) 25 MG tablet Take 25 mg by mouth 2 (two) times daily with a meal.      [provider]  clonazePAM (KLONOPIN) 0.5 MG tablet Take by mouth.    [provider]  HYDROcodone-acetaminophen (NORCO/VICODIN) 5-325 MG tablet Take 1-2 tablets by mouth every 6 (six) hours as needed for severe pain. 04/15/18   Antony Madura, PA-C   lisinopril (PRINIVIL,ZESTRIL) 5 MG tablet Take 5 mg by mouth daily.      [provider]  MELOXICAM PO Take by mouth.    [provider]  methocarbamol (ROBAXIN) 500 MG tablet Take 1 tablet (500 mg total) by mouth 2 (two) times daily. 05/16/18   Joy, Shawn C, PA-C  Multiple Vitamin (MULTIVITAMIN) tablet Take 1 tablet by mouth daily.      [provider]  nitroGLYCERIN (NITROSTAT) 0.4 MG SL tablet Place 0.4 mg under the tongue every 5 (five) minutes as needed.      [provider]  PARoxetine (PAXIL) 20 MG tablet Take 30 mg by mouth.     [provider]  potassium chloride SA (K-DUR,KLOR-CON) 20 MEQ tablet Take 1 tablet (20 mEq total) by mouth 2 (two) times daily for 4 days. 12/21/17 12/25/17  Long, Arlyss Repress, MD    Family History Family History  Problem Relation Age of Onset  . Bipolar disorder Brother   . Diabetes type II Father     Social History Social History   Tobacco Use  . Smoking status: Current Every Day Smoker  . Smokeless tobacco: Current User    Types: Snuff  Substance Use Topics  . Alcohol use: Yes    Frequency: Never  . Drug use: No     Allergies  Patient has no known allergies.   Review of Systems Review of Systems  Constitutional: Negative for diaphoresis.  HENT: Negative for congestion.   Respiratory: Negative for shortness of breath.   Cardiovascular: Positive for palpitations. Negative for chest pain.  Gastrointestinal: Negative for abdominal pain.  Musculoskeletal: Negative for back pain.  All other systems reviewed and are negative.    Physical Exam Updated Vital Signs BP (!) 158/98 (BP Location: Right Arm)   Pulse 67   Temp 98.5 F (36.9 C) (Oral)   Resp 14   Ht 5\' 8"  (1.727 m)   Wt 101.2 kg   SpO2 97%   BMI 33.91 kg/m   Physical Exam Constitutional:      Appearance: Normal appearance. He is obese.  HENT:     Head: Normocephalic and atraumatic.     Nose: Nose normal.     Mouth/Throat:      Mouth: Mucous membranes are moist.  Eyes:     Extraocular Movements: Extraocular movements intact.     Pupils: Pupils are equal, round, and reactive to light.  Neck:     Musculoskeletal: Normal range of motion and neck supple.  Cardiovascular:     Rate and Rhythm: Normal rate and regular rhythm.     Pulses: Normal pulses.     Heart sounds: Normal heart sounds.  Pulmonary:     Effort: Pulmonary effort is normal.     Breath sounds: Normal breath sounds.  Abdominal:     General: Abdomen is flat.     Tenderness: There is no abdominal tenderness.  Musculoskeletal: Normal range of motion.  Skin:    General: Skin is warm and dry.     Capillary Refill: Capillary refill takes less than 2 seconds.  Neurological:     General: No focal deficit present.     Mental Status: He is alert and oriented to person, place, and time.  Psychiatric:        Mood and Affect: Mood normal.        Behavior: Behavior normal.      ED Treatments / Results  Labs (all labs ordered are listed, but only abnormal results are displayed) Results for orders placed or performed during the hospital encounter of 06/05/18  Basic metabolic panel  Result Value Ref Range   Sodium 136 135 - 145 mmol/L   Potassium 4.3 3.5 - 5.1 mmol/L   Chloride 102 98 - 111 mmol/L   CO2 25 22 - 32 mmol/L   Glucose, Bld 119 (H) 70 - 99 mg/dL   BUN 21 (H) 6 - 20 mg/dL   Creatinine, Ser 2.45 0.61 - 1.24 mg/dL   Calcium 9.9 8.9 - 80.9 mg/dL   GFR calc non Af Amer >60 >60 mL/min   GFR calc Af Amer >60 >60 mL/min   Anion gap 9 5 - 15  CBC  Result Value Ref Range   WBC 12.0 (H) 4.0 - 10.5 K/uL   RBC 5.14 4.22 - 5.81 MIL/uL   Hemoglobin 14.8 13.0 - 17.0 g/dL   HCT 98.3 38.2 - 50.5 %   MCV 88.3 80.0 - 100.0 fL   MCH 28.8 26.0 - 34.0 pg   MCHC 32.6 30.0 - 36.0 g/dL   RDW 39.7 67.3 - 41.9 %   Platelets 336 150 - 400 K/uL   nRBC 0.0 0.0 - 0.2 %  Troponin I - ONCE - STAT  Result Value Ref Range   Troponin I <0.03 <0.03 ng/mL   Dg  Chest  2 View  Result Date: 06/05/2018 CLINICAL DATA:  Initial evaluation for acute palpitations. EXAM: CHEST - 2 VIEW COMPARISON:  Prior radiograph from 12/21/2017. FINDINGS: Left-sided transvenous pacemaker/AICD in place, stable. Cardiomegaly, unchanged. Mediastinal silhouette normal. Lungs normally inflated. No focal infiltrates, pulmonary edema, or pleural effusion. No pneumothorax. No acute osseous finding. IMPRESSION: 1. No active cardiopulmonary disease. 2. Stable cardiomegaly with left-sided transvenous pacemaker/AICD in place. Electronically Signed   By: Rise Mu M.D.   On: 06/05/2018 04:58    EKG EKG Interpretation  Date/Time:  Monday June 05 2018 04:50:57 EST Ventricular Rate:  64 PR Interval:    QRS Duration: 100 QT Interval:  403 QTC Calculation: 416 R Axis:   84 Text Interpretation:  Sinus rhythm Confirmed by Meridian Scherger ([id number]) on 06/05/2018 5:11:48 AM   Radiology Dg Chest 2 View  Result Date: 06/05/2018 CLINICAL DATA:  Initial evaluation for acute palpitations. EXAM: CHEST - 2 VIEW COMPARISON:  Prior radiograph from 12/21/2017. FINDINGS: Left-sided transvenous pacemaker/AICD in place, stable. Cardiomegaly, unchanged. Mediastinal silhouette normal. Lungs normally inflated. No focal infiltrates, pulmonary edema, or pleural effusion. No pneumothorax. No acute osseous finding. IMPRESSION: 1. No active cardiopulmonary disease. 2. Stable cardiomegaly with left-sided transvenous pacemaker/AICD in place. Electronically Signed   By: Rise Mu M.D.   On: 06/05/2018 04:58    Procedures Procedures (including critical care time)  Medications Ordered in ED Medications  sodium chloride flush (NS) 0.9 % injection 3 mL (3 mLs Intravenous Given 06/05/18 0506)     No chest pain or shortness of breath, defibrillator interrogated and nothing found.  No ectopy on the monitor.  Has worn a holter monitor in the past month for same and nothing found.  Follow up with  your PMD and cardiologist.   Final Clinical Impressions(s) / ED Diagnoses   Return for pain, intractable cough, productive cough,fevers >100.4 unrelieved by medication, shortness of breath, intractable vomiting, or diarrhea, abdominal pain, Inability to tolerate liquids or food, cough, altered mental status or any concerns. No signs of systemic illness or infection. The patient is nontoxic-appearing on exam and vital signs are within normal limits.   I have reviewed the triage vital signs and the nursing notes. Pertinent labs &imaging results that were available during my care of the patient were reviewed by me and considered in my medical decision making (see chart for details).  After history, exam, and medical workup I feel the patient has been appropriately medically screened and is safe for discharge home. Pertinent diagnoses were discussed with the patient. Patient was given return precautions.   Ceazia Harb, MD 06/05/18 [phone]

## 2018-06-05 NOTE — ED Notes (Signed)
Medtronic ICD interrogated at this time.  

## 2018-06-11 ENCOUNTER — Other Ambulatory Visit: Payer: Self-pay

## 2018-06-11 ENCOUNTER — Emergency Department (HOSPITAL_BASED_OUTPATIENT_CLINIC_OR_DEPARTMENT_OTHER)
Admission: EM | Admit: 2018-06-11 | Discharge: 2018-06-11 | Disposition: A | Discharge status: Home / Self Care | Payer: No Typology Code available for payment source | Attending: Emergency Medicine | Admitting: Emergency Medicine

## 2018-06-11 ENCOUNTER — Encounter (HOSPITAL_BASED_OUTPATIENT_CLINIC_OR_DEPARTMENT_OTHER): Payer: Self-pay | Admitting: *Deleted

## 2018-06-11 DIAGNOSIS — I10 Essential (primary) hypertension: Secondary | ICD-10-CM | POA: Diagnosis not present

## 2018-06-11 DIAGNOSIS — M25552 Pain in left hip: Secondary | ICD-10-CM | POA: Diagnosis not present

## 2018-06-11 DIAGNOSIS — Z79899 Other long term (current) drug therapy: Secondary | ICD-10-CM | POA: Diagnosis not present

## 2018-06-11 DIAGNOSIS — F17290 Nicotine dependence, other tobacco product, uncomplicated: Secondary | ICD-10-CM | POA: Insufficient documentation

## 2018-06-11 DIAGNOSIS — G8929 Other chronic pain: Secondary | ICD-10-CM | POA: Diagnosis not present

## 2018-06-11 MED ORDER — HYDROCODONE-ACETAMINOPHEN 10-325 MG PO TABS: 1.0000 | ORAL_TABLET | Freq: Three times a day (TID) | ORAL | 0 refills | Status: DC | PRN

## 2018-06-11 MED ORDER — HYDROMORPHONE HCL 1 MG/ML IJ SOLN
2.0000 mg | Freq: Once | INTRAMUSCULAR | Status: AC
  Administered 2018-06-11: 2 mg via INTRAMUSCULAR
  Filled 2018-06-11: qty 2

## 2018-06-11 NOTE — ED Provider Notes (Signed)
[facility] Provider Note: Terry Dell, MD, FACEP  CSN: [id number] MRN: [id number] ARRIVAL: 06/11/18 at 0026 ROOM: [facility]   CHIEF COMPLAINT  Hip Pain   HISTORY OF PRESENT ILLNESS  06/11/18 2:15 AM Terry Mcconnell is a 48 y.o. male with chronic hip pain for which he has been receiving multiple narcotic prescriptions from multiple providers over the past several months.  His last prescription for hydrocodone has run out as scheduled.  He is here with 8 out of 10 pain in his left hip, worse with weightbearing.  He is requesting pain control until he can contact his orthopedist tomorrow.   Past Medical History:  Diagnosis Date  . Hypertension   . Kidney stones   . 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 CARDIAC DEFIBRILLATOR PLACEMENT     x 2   . CARDIAC PACEMAKER PLACEMENT     x 2   . HIP SURGERY    . left knee surgery  2007  . PACEMAKER REVISION    . SHOULDER SURGERY    . skin grafts  2006   x 2     Family History  Problem Relation Age of Onset  . Bipolar disorder Brother   . Diabetes type II Father     Social History   Tobacco Use  . Smoking status: Current Every Day Smoker  . Smokeless tobacco: Current User    Types: Snuff  Substance Use Topics  . Alcohol use: Yes    Frequency: Never  . Drug use: No    Prior to Admission medications   Medication Sig Start Date End Date Taking? Authorizing Provider  carvedilol (COREG) 25 MG tablet Take 25 mg by mouth 2 (two) times daily with a meal.     Yes [provider]  clonazePAM (KLONOPIN) 0.5 MG tablet Take by mouth.   Yes [provider]  lisinopril (PRINIVIL,ZESTRIL) 5 MG tablet Take 5 mg by mouth daily.     Yes [provider]  MELOXICAM PO Take by mouth.   Yes [provider]  Multiple Vitamin (MULTIVITAMIN) tablet Take 1 tablet by mouth daily.     Yes [provider]  PARoxetine  (PAXIL) 20 MG tablet Take 30 mg by mouth.    Yes [provider]  HYDROcodone-acetaminophen (NORCO) 10-325 MG tablet Take 1 tablet by mouth every 8 (eight) hours as needed for severe pain. 06/11/18   Harol Shabazz, Jonny Ruiz, MD    Allergies Patient has no known allergies.   REVIEW OF SYSTEMS  Negative except as noted here or in the History of Present Illness.   PHYSICAL EXAMINATION  Initial Vital Signs Blood pressure (!) 119/98, pulse 80, temperature 99 F (37.2 C), temperature source Oral, resp. rate 16, height 5\' 8"  (1.727 m), weight 101 kg, SpO2 97 %.  Examination General: Well-developed, well-nourished male in no acute distress; appearance consistent with age of record HENT: normocephalic; atraumatic Eyes: Normal appearance Neck: supple Heart: regular rate and rhythm Lungs: clear to auscultation bilaterally Abdomen: soft; nondistended; nontender; bowel sounds present Extremities: No deformity; surgical changes and atrophy to left lower leg; pain on passive movement of left hip; pulses normal Neurologic: Awake, alert and oriented; motor function intact in all extremities and symmetric; no facial droop Skin: Warm and dry Psychiatric: Flat affect; circumstantial speech   RESULTS  Summary of this visit's results, reviewed by myself:   EKG Interpretation  Date/Time:    Ventricular  Rate:    PR Interval:    QRS Duration:   QT Interval:    QTC Calculation:   R Axis:     Text Interpretation:        Laboratory Studies: No results found for this or any previous visit (from the past 24 hour(s)). Imaging Studies: No results found.  ED COURSE and MDM  Nursing notes and initial vitals signs, including pulse oximetry, reviewed.  Vitals:   06/11/18 0034 06/11/18 0040  BP:  (!) 119/98  Pulse:  80  Resp:  16  Temp:  99 F (37.2 C)  TempSrc:  Oral  SpO2:  97%  Weight: 101 kg   Height: 5\' 8"  (1.727 m)    We will treat patient's pain with a shot of Dilaudid in the ED.   This was his principal request.  We will also provide 3 hydrocodone tablets to control his pain until he can contact his orthopedist, Dr. Thurnell Lose.  He was advised that we normally do not prescribe narcotics for the treatment of chronic pain.  PROCEDURES    ED DIAGNOSES     ICD-10-CM   1. Chronic left hip pain M25.552    G89.29        Yael Angerer, Jonny Ruiz, MD 06/11/18 [phone]

## 2018-06-11 NOTE — ED Notes (Signed)
ED Provider at bedside. 

## 2018-06-11 NOTE — ED Notes (Signed)
Pt called out needing a urinal.

## 2018-06-11 NOTE — ED Notes (Signed)
Pt using call bell several times. Stating " when is doctor coming in". Pt informed MD will be in to see him soon.

## 2018-06-11 NOTE — ED Notes (Signed)
PT states chronic left hip pain unbearble tonight. Denies recent injury or N/T

## 2018-06-11 NOTE — ED Notes (Signed)
Pt rang call bell asking for MD. Pt requesting this RN to ask MD to give him Dilaudid. Pt informed that the MD will  Be in to exam him and prescribe treatment.

## 2018-06-11 NOTE — ED Triage Notes (Addendum)
Pt has left hip pain. Had hip replacement 2 years ago. Flare up of pain today. He had cortisone injection earlier this month. Pt ambulated to to triage with cane.

## 2018-06-13 ENCOUNTER — Emergency Department (HOSPITAL_BASED_OUTPATIENT_CLINIC_OR_DEPARTMENT_OTHER)
Admission: EM | Admit: 2018-06-13 | Discharge: 2018-06-13 | Disposition: A | Discharge status: Home / Self Care | Payer: Non-veteran care | Attending: Emergency Medicine | Admitting: Emergency Medicine

## 2018-06-13 ENCOUNTER — Encounter (HOSPITAL_BASED_OUTPATIENT_CLINIC_OR_DEPARTMENT_OTHER): Payer: Self-pay | Admitting: *Deleted

## 2018-06-13 ENCOUNTER — Other Ambulatory Visit: Payer: Self-pay

## 2018-06-13 DIAGNOSIS — I10 Essential (primary) hypertension: Secondary | ICD-10-CM | POA: Insufficient documentation

## 2018-06-13 DIAGNOSIS — Z950 Presence of cardiac pacemaker: Secondary | ICD-10-CM | POA: Insufficient documentation

## 2018-06-13 DIAGNOSIS — H5713 Ocular pain, bilateral: Secondary | ICD-10-CM

## 2018-06-13 DIAGNOSIS — Z765 Malingerer [conscious simulation]: Secondary | ICD-10-CM

## 2018-06-13 DIAGNOSIS — Z79899 Other long term (current) drug therapy: Secondary | ICD-10-CM | POA: Diagnosis not present

## 2018-06-13 DIAGNOSIS — F17200 Nicotine dependence, unspecified, uncomplicated: Secondary | ICD-10-CM | POA: Diagnosis not present

## 2018-06-13 MED ORDER — TETRACAINE HCL 0.5 % OP SOLN
2.0000 [drp] | Freq: Once | OPHTHALMIC | Status: DC
  Filled 2018-06-13: qty 4

## 2018-06-13 NOTE — Discharge Instructions (Signed)
Stop coming to the emergency room for pain medication.

## 2018-06-13 NOTE — ED Provider Notes (Signed)
[facility] EMERGENCY DEPARTMENT Provider Note   CSN: [id number] Arrival date & time: 06/13/18  1708     History   Chief Complaint Chief Complaint  Patient presents with  . Eye Problem    HPI Terry Mcconnell is a 48 y.o. male.  HPI   47yM with bilateral eye pain. He states he was seen at the [place] today and had three different eye drops during an eye exam. Shortly after he left he began having eye pain/soreness which has persisted. He was prescribed two different eye drop which he continues to use (moxifloxacin and atropine). Bottles pulled out of his pocket with the cap for the atropine missing. Pain is worse when looking at light. Doesn't think vision has changed but has a hard time keeping them open if it is too bright.   Telephone Encounter - Delong, Ronnald Ramp, RN - 06/13/2018 4:20 PM EST  Patient called, two patient identifiers confirmed. [facility] states [facility]" and the stinging was so severe that he has taken all 15 tablets of Norco prescribed yesterday.   Past Medical History:  Diagnosis Date  . Hypertension   . Kidney stones   . Left ventricular noncompaction cardiomyopathy [facility])     Patient Active Problem List   Diagnosis Date Noted  . Seborrheic keratosis 09/10/2010  . Cardiomyopathy 08/13/2010  . 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 Hypertension 08/13/2010  . Other and unspecified hyperlipidemia 08/13/2010  . Anxiety 08/13/2010  . Ankle pain 08/13/2010    Past Surgical History:  Procedure Laterality Date  . CARDIAC DEFIBRILLATOR PLACEMENT     x 2   . CARDIAC PACEMAKER PLACEMENT     x 2   . HIP SURGERY    . left knee surgery  2007  . PACEMAKER REVISION    . SHOULDER SURGERY    . skin grafts  2006   x 2         Home Medications    Prior to Admission medications   Medication Sig Start Date End Date Taking? Authorizing Provider  carvedilol (COREG) 25 MG tablet Take 25 mg by mouth 2  (two) times daily with a meal.      [provider]  clonazePAM (KLONOPIN) 0.5 MG tablet Take by mouth.    [provider]  HYDROcodone-acetaminophen (NORCO) 10-325 MG tablet Take 1 tablet by mouth every 8 (eight) hours as needed for severe pain. 06/11/18   Molpus, John, MD  lisinopril (PRINIVIL,ZESTRIL) 5 MG tablet Take 5 mg by mouth daily.      [provider]  MELOXICAM PO Take by mouth.    [provider]  Multiple Vitamin (MULTIVITAMIN) tablet Take 1 tablet by mouth daily.      [provider]  PARoxetine (PAXIL) 20 MG tablet Take 30 mg by mouth.     [provider]    Family History Family History  Problem Relation Age of Onset  . Bipolar disorder Brother   . Diabetes type II Father     Social History Social History   Tobacco Use  . Smoking status: Current Every Day Smoker  . Smokeless tobacco: Current User    Types: Snuff  Substance Use Topics  . Alcohol use: Yes    Frequency: Never  . Drug use: No     Allergies   Patient has no known allergies.   Review of Systems Review of Systems  All systems reviewed and  negative, other than as noted in HPI.  Physical Exam Updated Vital Signs BP 127/76   Pulse 93   Temp 98 F (36.7 C) (Oral)   Resp 16   Ht 5\' 8"  (1.727 m)   Wt 101 kg   SpO2 99%   BMI 33.86 kg/m   Physical Exam Vitals signs and nursing note reviewed.  Constitutional:      General: He is not in acute distress.    Appearance: He is well-developed.  HENT:     Head: Normocephalic and atraumatic.  Eyes:     General:        Right eye: No discharge.        Left eye: No discharge.     Conjunctiva/sclera: Conjunctivae normal.     Comments: Lids/lashed periorbital tissues normal in appearance. Pupils symmetric. Dilated. Somewhat reactive. Mild conjunctival injection OD. Clear OS. Anterior chambers clear. EOMI. Reports no improvement of symptoms with tetracaine. IOP OD:18, OS: 19.   Neck:      Musculoskeletal: Neck supple.  Cardiovascular:     Rate and Rhythm: Normal rate and regular rhythm.     Heart sounds: Normal heart sounds. No murmur. No friction rub. No gallop.   Pulmonary:     Effort: Pulmonary effort is normal. No respiratory distress.     Breath sounds: Normal breath sounds.  Abdominal:     General: There is no distension.     Palpations: Abdomen is soft.     Tenderness: There is no abdominal tenderness.  Musculoskeletal:        General: No tenderness.  Skin:    General: Skin is warm and dry.  Neurological:     Mental Status: He is alert.  Psychiatric:        Behavior: Behavior normal.        Thought Content: Thought content normal.      ED Treatments / Results  Labs (all labs ordered are listed, but only abnormal results are displayed) Labs Reviewed - No data to display  EKG None  Radiology No results found.  Procedures Procedures (including critical care time)  Medications Ordered in ED Medications  tetracaine (PONTOCAINE) 0.5 % ophthalmic solution 2 drop (has no administration in time range)     Initial Impression / Assessment and Plan / ED Course  I have reviewed the triage vital signs and the nursing notes.  Pertinent labs & imaging results that were available during my care of the patient were reviewed by me and considered in my medical decision making (see chart for details).     47yM with b/l eye pain. I suspect he is just having discomfort from his eyes being dilated. Symptoms consistent with that. Miosis on exam and also prescribed atropine. I checked his IOPs to make sure this wasn't acute angle closure glaucoma precipitated by eye dilation and they are normal b/l.   He continually tried to bargain for pain medication. He initially told me he took "a vicodin" after his eye appointment and "then another two hours later" because the pain wasn't better. When I discussed the telephone correspondence with his orthopedist's office about  him taking all of the pain medication that he was prescribed yesterday, he changed his story and then proceeded to continue to ask for IM pain medication.   I saw him one month ago for chronic pain. He immediately went to another ER that same day after I did not give him pain medication. In the past month he has had multiple  ER visits for pain related complaints and received pain medicine in the ED and also prescriptions from several different providers.   I acknowledged his pain but explained his behavior is inappropriate. If it continues then he is going to have a hard time finding anyone willing to give him anything. He needs to get his pain medications from a single provider.   [facility] and further questions regarding those meds. There isn't an emergent medical condition here today. Advised to wash his hands before using eye drops, keep the tips clean and caps to bottles on.   Final Clinical Impressions(s) / ED Diagnoses   Final diagnoses:  Pain of both eyes  Drug-seeking behavior    ED Discharge Orders    None       Raeford Razor, MD 06/13/18 2004

## 2018-06-13 NOTE — ED Triage Notes (Addendum)
He was at the [place] today for an eye exam. They gave him eye drops to dilate his eyes. His eyes have been burning since. He took a Microbiologist for pain when he got home.

## 2018-06-14 ENCOUNTER — Emergency Department (HOSPITAL_BASED_OUTPATIENT_CLINIC_OR_DEPARTMENT_OTHER)
Admission: EM | Admit: 2018-06-14 | Discharge: 2018-06-14 | Disposition: A | Discharge status: Home / Self Care | Payer: Non-veteran care | Attending: Emergency Medicine | Admitting: Emergency Medicine

## 2018-06-14 ENCOUNTER — Encounter (HOSPITAL_BASED_OUTPATIENT_CLINIC_OR_DEPARTMENT_OTHER): Payer: Self-pay | Admitting: *Deleted

## 2018-06-14 DIAGNOSIS — F419 Anxiety disorder, unspecified: Secondary | ICD-10-CM | POA: Insufficient documentation

## 2018-06-14 DIAGNOSIS — F17200 Nicotine dependence, unspecified, uncomplicated: Secondary | ICD-10-CM | POA: Insufficient documentation

## 2018-06-14 DIAGNOSIS — F1010 Alcohol abuse, uncomplicated: Secondary | ICD-10-CM | POA: Insufficient documentation

## 2018-06-14 DIAGNOSIS — Z950 Presence of cardiac pacemaker: Secondary | ICD-10-CM | POA: Insufficient documentation

## 2018-06-14 DIAGNOSIS — Z79899 Other long term (current) drug therapy: Secondary | ICD-10-CM | POA: Diagnosis not present

## 2018-06-14 DIAGNOSIS — H5789 Other specified disorders of eye and adnexa: Secondary | ICD-10-CM | POA: Diagnosis present

## 2018-06-14 DIAGNOSIS — I10 Essential (primary) hypertension: Secondary | ICD-10-CM | POA: Diagnosis not present

## 2018-06-14 HISTORY — DX: Alcohol abuse, uncomplicated: F10.10

## 2018-06-14 MED ORDER — FLUORESCEIN SODIUM 1 MG OP STRP
ORAL_STRIP | OPHTHALMIC | Status: AC
  Filled 2018-06-14: qty 1

## 2018-06-14 MED ORDER — TETRACAINE HCL 0.5 % OP SOLN
1.0000 [drp] | Freq: Once | OPHTHALMIC | Status: DC
  Filled 2018-06-14: qty 4

## 2018-06-14 MED ORDER — FLUORESCEIN-BENOXINATE 0.25-0.4 % OP SOLN
1.0000 [drp] | Freq: Once | OPHTHALMIC | Status: DC
  Filled 2018-06-14: qty 5

## 2018-06-14 MED ORDER — FLUORESCEIN SODIUM 1 MG OP STRP: 1.0000 | ORAL_STRIP | Freq: Once | OPHTHALMIC | Status: DC

## 2018-06-14 MED ORDER — KETOROLAC TROMETHAMINE 15 MG/ML IJ SOLN
15.0000 mg | Freq: Once | INTRAMUSCULAR | Status: AC
  Administered 2018-06-14: 15 mg via INTRAMUSCULAR
  Filled 2018-06-14: qty 1

## 2018-06-14 MED ORDER — KETOROLAC TROMETHAMINE 15 MG/ML IJ SOLN: 7.5000 mg | Freq: Once | INTRAMUSCULAR | Status: DC

## 2018-06-14 NOTE — ED Notes (Signed)
Per EMS:  Pt seen at [facility] earlier today, right corneal abrasion/scratch.  Pt was given eye drops but is having pain.  Pt smelling of ETOH.

## 2018-06-14 NOTE — ED Notes (Signed)
ED Provider at bedside. 

## 2018-06-14 NOTE — ED Notes (Signed)
Pt sts "If you could just give me a shot of Dilaudid it would feel better"

## 2018-06-14 NOTE — ED Triage Notes (Addendum)
Brought in by EMS, ETOH  from home c/o right eye problem after having eye drops in by [place] today, seen here yesterday for same

## 2018-06-14 NOTE — ED Notes (Signed)
Pt came  to nursing desk states he  called EMS to take him to [facility].  EMS states the staff here need to make arrangement for that. Pt d/c and escorted to lobby. [facility] aware of pt.

## 2018-06-14 NOTE — ED Notes (Signed)
Upon check out in registration , pt asked registration to call EMS for him.

## 2018-06-14 NOTE — ED Provider Notes (Signed)
[facility] EMERGENCY DEPARTMENT Provider Note   CSN: [id number] Arrival date & time: 06/14/18  1823   History   Chief Complaint Chief Complaint  Patient presents with  . Eye Problem    HPI Terry Mcconnell is a 48 y.o. male.  Patient reports that he woke up yesterday with thick crusting over his eye and eye pain.  States that he had to put a warm compress over in order to be able to pull this off.  He then was seen here at the emergency room and was told to follow-up.  Patient states that he went to the [place] today where he was told that he has a large scratch on his cornea and was given eyedrops.  Patient has atropine and moxifloxacin drops next to him.  Patient states that since he is started using his eyedrops that his eye pain has gotten much worse.  Patient states that he has been experiencing blurry vision and that he "cannot see" out of his right eye.  Patient reports that he is having pain with movement of his eye.  Denies any fevers or chills.  Patient states that he would like to get something for the pain in order to be able to be able to go to sleep at his home.     Past Medical History:  Diagnosis Date  . ETOH abuse   . Hypertension   . Kidney stones   . 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 Seborrheic keratosis 09/10/2010  . Cardiomyopathy 08/13/2010  . 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 Hypertension 08/13/2010  . Other and unspecified hyperlipidemia 08/13/2010  . Anxiety 08/13/2010  . Ankle pain 08/13/2010    Past Surgical History:  Procedure Laterality Date  . CARDIAC DEFIBRILLATOR PLACEMENT     x 2   . CARDIAC PACEMAKER PLACEMENT     x 2   . HIP SURGERY    . left knee surgery  2007  . PACEMAKER REVISION    . SHOULDER SURGERY    . skin grafts  2006   x 2         Home Medications    Prior to Admission medications   Medication Sig  Start Date End Date Taking? Authorizing Provider  carvedilol (COREG) 25 MG tablet Take 25 mg by mouth 2 (two) times daily with a meal.      [provider]  clonazePAM (KLONOPIN) 0.5 MG tablet Take by mouth.    [provider]  HYDROcodone-acetaminophen (NORCO) 10-325 MG tablet Take 1 tablet by mouth every 8 (eight) hours as needed for severe pain. 06/11/18   Molpus, John, MD  lisinopril (PRINIVIL,ZESTRIL) 5 MG tablet Take 5 mg by mouth daily.      [provider]  MELOXICAM PO Take by mouth.    [provider]  Multiple Vitamin (MULTIVITAMIN) tablet Take 1 tablet by mouth daily.      [provider]  PARoxetine (PAXIL) 20 MG tablet Take 30 mg by mouth.     [provider]    Family History Family History  Problem Relation Age of Onset  . Bipolar disorder Brother   . Diabetes type II Father     Social History Social History   Tobacco Use  . Smoking status: Current Every Day Smoker  . Smokeless tobacco: Current User    Types: Snuff  Substance Use Topics  .  Alcohol use: Yes    Frequency: Never  . Drug use: No     Allergies   Patient has no known allergies.   Review of Systems Review of Systems  Constitutional: Negative for chills and fever.  HENT: Positive for congestion, rhinorrhea and tinnitus. Negative for drooling, ear discharge, ear pain, hearing loss and sore throat.   Eyes: Positive for pain, discharge, redness and visual disturbance. Negative for itching.  Cardiovascular: Negative for chest pain.  Gastrointestinal: Negative for nausea and vomiting.  Neurological: Positive for headaches. Negative for dizziness, speech difficulty, weakness, light-headedness and numbness.  All other systems reviewed and are negative.    Physical Exam Updated Vital Signs BP 117/74 (BP Location: Left Arm)   Pulse (!) 108   Temp 98.2 F (36.8 C)   Resp 18   SpO2 97%   Physical Exam Vitals signs and nursing note reviewed.    Constitutional:      Appearance: Normal appearance. He is normal weight.  Eyes:     General: Lids are normal. Vision grossly intact. No scleral icterus.       Right eye: No foreign body or discharge.     Intraocular pressure: Right eye pressure is 20 mmHg.     Extraocular Movements: Extraocular movements intact.     Right eye: No nystagmus.     Conjunctiva/sclera:     Right eye: Right conjunctiva is injected.     Comments: Patient with dilated fixed pupils bilaterally, mildly reactive to light  Neurological:     Mental Status: He is alert.    ED Treatments / Results  Labs (all labs ordered are listed, but only abnormal results are displayed) Labs Reviewed - No data to display  EKG None  Radiology No results found.  Procedures Procedures (including critical care time)  Medications Ordered in ED Medications  tetracaine (PONTOCAINE) 0.5 % ophthalmic solution 1-2 drop (has no administration in time range)  fluorescein 1 MG ophthalmic strip (has no administration in time range)  fluorescein ophthalmic strip 1 strip (has no administration in time range)  ketorolac (TORADOL) 15 MG/ML injection 15 mg (15 mg Intramuscular Given 06/14/18 2227)   Initial Impression / Assessment and Plan / ED Course  I have reviewed the triage vital signs and the nursing notes.  Pertinent labs & imaging results that were available during my care of the patient were reviewed by me and considered in my medical decision making (see chart for details).   48 year old male here with eye pain and change in vision.  Patient states that he was seen at the [facility] and given eyedrops today and told that he has a scratch on his cornea.  Patient is poor historian.  Had to be woken up from sleep in order to answer questions, but states he wants the pain to go away so that he can be able to sleep.   Miosis on exam and also prescribed atropine. IOPs x2 wnl to make sure this wasn't acute angle closure glaucoma precipitated  by eye dilation and they are normal b/l.  Also perform bedside ultrasound to ensure that this is not retinal detachment given that patient is experiencing pain. Bedside ultrasound normal.  Spoke with ophthalmologist Dr. Carmela Rima over the phone who recommends that patient be seen in the office.  States that it may be more prudent for him to be seen in his office tomorrow morning but if he is unable to make that appointment it is also acceptable for him [facility]  Patient was given Toradol for pain given history of drug-seeking behavior and normal renal function on 06/05/2018.  Patient refused Toradol at the time stating that he needed different pain medication.  Stated that he would call 911 in order to have an ambulance [facility] if we are not going give him something to relieve his pain.  Patient informed that this not allowed.  Patient then requested that he be able to receive Toradol.    Patient stable for discharge.  Return precautions discussed.  [place] Tacarra Justo, DO PGY-2, [facility]   Final Clinical Impressions(s) / ED Diagnoses   Final diagnoses:  Eye irritation    ED Discharge Orders    None       Sevanna Ballengee, [place], DO 06/14/18 2230    Gwyneth SproutPlunkett, Whitney, MD 06/14/18 2333

## 2018-06-14 NOTE — Discharge Instructions (Signed)
There were no acute problems noticed on your eye exam today in the emergency room.  Please follow-up with the ophthalmologist Dr. Allena Katz tomorrow morning in his office; you will need to call and schedule an appointment.  If you are unable to make that appointment then it is prudent that you follow-up at the [facility] hospital in [place] on Friday with your ophthalmologist as there.  You have any worsening symptoms such as change in vision or eye pain then please see your regular doctor or visit the ED after hours.

## 2018-06-14 NOTE — ED Notes (Signed)
Upon discharge , pt states " im going to call EMS from here to take me to [facility]" , pt  Now agreed to Toradol .

## 2018-07-09 ENCOUNTER — Emergency Department (HOSPITAL_BASED_OUTPATIENT_CLINIC_OR_DEPARTMENT_OTHER)
Admission: EM | Admit: 2018-07-09 | Discharge: 2018-07-10 | Disposition: A | Discharge status: Home / Self Care | Payer: No Typology Code available for payment source | Attending: Emergency Medicine | Admitting: Emergency Medicine

## 2018-07-09 ENCOUNTER — Other Ambulatory Visit: Payer: Self-pay

## 2018-07-09 ENCOUNTER — Encounter (HOSPITAL_BASED_OUTPATIENT_CLINIC_OR_DEPARTMENT_OTHER): Payer: Self-pay | Admitting: Emergency Medicine

## 2018-07-09 DIAGNOSIS — G8929 Other chronic pain: Secondary | ICD-10-CM

## 2018-07-09 DIAGNOSIS — Z950 Presence of cardiac pacemaker: Secondary | ICD-10-CM | POA: Diagnosis not present

## 2018-07-09 DIAGNOSIS — Z765 Malingerer [conscious simulation]: Secondary | ICD-10-CM

## 2018-07-09 DIAGNOSIS — F17200 Nicotine dependence, unspecified, uncomplicated: Secondary | ICD-10-CM | POA: Diagnosis not present

## 2018-07-09 DIAGNOSIS — M25559 Pain in unspecified hip: Secondary | ICD-10-CM | POA: Diagnosis present

## 2018-07-09 DIAGNOSIS — I10 Essential (primary) hypertension: Secondary | ICD-10-CM | POA: Insufficient documentation

## 2018-07-09 DIAGNOSIS — Z79899 Other long term (current) drug therapy: Secondary | ICD-10-CM | POA: Insufficient documentation

## 2018-07-09 DIAGNOSIS — M25552 Pain in left hip: Secondary | ICD-10-CM | POA: Diagnosis not present

## 2018-07-09 MED ORDER — KETOROLAC TROMETHAMINE 60 MG/2ML IM SOLN
30.0000 mg | Freq: Once | INTRAMUSCULAR | Status: AC
  Administered 2018-07-09: 30 mg via INTRAMUSCULAR
  Filled 2018-07-09: qty 2

## 2018-07-09 NOTE — ED Triage Notes (Signed)
Brought in by EMS for chronic left hip pain

## 2018-07-09 NOTE — ED Notes (Signed)
ED Provider at bedside. 

## 2018-07-09 NOTE — ED Notes (Signed)
Pt left at this time with all belongings.  

## 2018-07-09 NOTE — ED Notes (Signed)
Pt requesting dilaudid, MD will not prescribe this medicine for chronic pain. Pt notified.

## 2018-07-09 NOTE — ED Provider Notes (Signed)
[facility] EMERGENCY DEPARTMENT Provider Note   CSN: [id number] Arrival date & time: 07/09/18  2102    History   Chief Complaint Chief Complaint  Patient presents with  . Hip Pain    HPI Terry Mcconnell is a 48 y.o. male.     The history is provided by the patient.  Hip Pain  This is a chronic problem. The current episode started more than 1 week ago (2 years). The problem occurs constantly. The problem has not changed since onset.Pertinent negatives include no chest pain, no abdominal pain, no headaches and no shortness of breath. Nothing aggravates the symptoms. Nothing relieves the symptoms. The treatment provided no relief.  Has chronic pain post replacement.  No trauma. No falls.  No weakness, no numbness.  No f/c/r.  He states that his orthopedic surgeon is sending him to pain management as the vicodin is no longer helping him.    Past Medical History:  Diagnosis Date  . ETOH abuse   . Hypertension   . Kidney stones   . 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 Seborrheic keratosis 09/10/2010  . Cardiomyopathy 08/13/2010  . 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 Hypertension 08/13/2010  . Other and unspecified hyperlipidemia 08/13/2010  . Anxiety 08/13/2010  . Ankle pain 08/13/2010    Past Surgical History:  Procedure Laterality Date  . CARDIAC DEFIBRILLATOR PLACEMENT     x 2   . CARDIAC PACEMAKER PLACEMENT     x 2   . HIP SURGERY    . left knee surgery  2007  . PACEMAKER REVISION    . SHOULDER SURGERY    . skin grafts  2006   x 2         Home Medications    Prior to Admission medications   Medication Sig Start Date End Date Taking? Authorizing Provider  carvedilol (COREG) 25 MG tablet Take 25 mg by mouth 2 (two) times daily with a meal.      [provider]  clonazePAM (KLONOPIN) 0.5 MG tablet Take by mouth.    [provider]  HYDROcodone-acetaminophen (NORCO) 10-325 MG tablet Take 1 tablet by mouth every 8 (eight) hours as needed for severe pain. 06/11/18   Molpus, John, MD  lisinopril (PRINIVIL,ZESTRIL) 5 MG tablet Take 5 mg by mouth daily.      [provider]  MELOXICAM PO Take by mouth.    [provider]  Multiple Vitamin (MULTIVITAMIN) tablet Take 1 tablet by mouth daily.      [provider]  PARoxetine (PAXIL) 20 MG tablet Take 30 mg by mouth.     [provider]    Family History Family History  Problem Relation Age of Onset  . Bipolar disorder Brother   . Diabetes type II Father     Social History Social History   Tobacco Use  . Smoking status: Current Every Day Smoker  . Smokeless tobacco: Current User    Types: Snuff  Substance Use Topics  . Alcohol use: Yes    Frequency: Never  . Drug use: No     Allergies   Patient has no known allergies.   Review of Systems Review of Systems  Constitutional: Negative for fever.  Respiratory: Negative for shortness of breath.   Cardiovascular: Negative for chest pain.  Gastrointestinal: Negative for abdominal pain.  Musculoskeletal: Positive for arthralgias. Negative  for back pain, joint swelling, neck pain and neck stiffness.  Neurological: Negative for weakness, numbness and headaches.  All other systems reviewed and are negative.    Physical Exam Updated Vital Signs BP (!) 131/91 (BP Location: Left Arm)   Pulse 95   Temp 97.9 F (36.6 C) (Oral)   Resp 20   Ht 5\' 8"  (1.727 m)   Wt 96.2 kg   SpO2 98%   BMI 32.23 kg/m   Physical Exam Vitals signs and nursing note reviewed.  Constitutional:      Appearance: He is normal weight.  HENT:     Head: Normocephalic and atraumatic.     Nose: Nose normal.     Mouth/Throat:     Mouth: Mucous membranes are moist.     Pharynx: Oropharynx is clear.  Eyes:     Extraocular Movements: Extraocular movements intact.     Conjunctiva/sclera:  Conjunctivae normal.     Pupils: Pupils are equal, round, and reactive to light.  Neck:     Musculoskeletal: Normal range of motion and neck supple.  Cardiovascular:     Rate and Rhythm: Normal rate and regular rhythm.     Pulses: Normal pulses.     Heart sounds: Normal heart sounds.  Pulmonary:     Effort: Pulmonary effort is normal.     Breath sounds: Normal breath sounds.  Abdominal:     General: Abdomen is flat. Bowel sounds are normal.     Tenderness: There is no abdominal tenderness.  Musculoskeletal: Normal range of motion.     Left hip: Normal.     Left knee: Normal.     Left ankle: Normal. Achilles tendon normal.     Left upper leg: Normal.     Left lower leg: Normal.     Left foot: Normal.     Comments: No foreshortening no rotation,  FROM.  Non tender on palpation well seated.  Intact dorsalis pedis    Skin:    General: Skin is warm and dry.     Capillary Refill: Capillary refill takes less than 2 seconds.  Neurological:     General: No focal deficit present.     Mental Status: He is alert and oriented to person, place, and time.  Psychiatric:        Mood and Affect: Mood normal.        Behavior: Behavior normal.      ED Treatments / Results  Labs (all labs ordered are listed, but only abnormal results are displayed) Labs Reviewed - No data to display  EKG None  Radiology No results found.  Procedures Procedures (including critical care time)  Medications Ordered in ED Medications  ketorolac (TORADOL) injection 30 mg (has no administration in time range)     Chronic in nature.  No indication for imaging at this time. I suspect drug seeking.    Final Clinical Impressions(s) / ED Diagnoses   DEA database reviewed.  Overdose score is 670.  I will not be giving dilaudid nor a narcotic RX for chronic pain.  Was given Toradol in the ED.    Return for pain, intractable cough, fevers >100.4 unrelieved by medication, shortness of breath, intractable  vomiting, chest pain, shortness of breath, weakness numbness, changes in speech, facial asymmetry,abdominal pain, passing out,Inability to tolerate liquids or food, cough, altered mental status or any concerns. No signs of systemic illness or infection. The patient is nontoxic-appearing on exam and vital signs are within normal limits.  I have reviewed the triage vital signs and the nursing notes. Pertinent labs &imaging results that were available during my care of the patient were reviewed by me and considered in my medical decision making (see chart for details).  After history, exam, and medical workup I feel the patient has been appropriately medically screened and is safe for discharge home. Pertinent diagnoses were discussed with the patient. Patient was given return precautions.    Taiden Raybourn, MD 07/10/18 8527

## 2019-01-12 DIAGNOSIS — F4310 Post-traumatic stress disorder, unspecified: Secondary | ICD-10-CM | POA: Insufficient documentation

## 2019-02-07 ENCOUNTER — Emergency Department (HOSPITAL_BASED_OUTPATIENT_CLINIC_OR_DEPARTMENT_OTHER)
Admission: EM | Admit: 2019-02-07 | Discharge: 2019-02-07 | Disposition: A | Discharge status: Home / Self Care | Payer: No Typology Code available for payment source | Attending: Emergency Medicine | Admitting: Emergency Medicine

## 2019-02-07 ENCOUNTER — Other Ambulatory Visit: Payer: Self-pay

## 2019-02-07 ENCOUNTER — Encounter (HOSPITAL_BASED_OUTPATIENT_CLINIC_OR_DEPARTMENT_OTHER): Payer: Self-pay | Admitting: Emergency Medicine

## 2019-02-07 ENCOUNTER — Emergency Department (HOSPITAL_BASED_OUTPATIENT_CLINIC_OR_DEPARTMENT_OTHER): Payer: No Typology Code available for payment source

## 2019-02-07 DIAGNOSIS — F17200 Nicotine dependence, unspecified, uncomplicated: Secondary | ICD-10-CM | POA: Insufficient documentation

## 2019-02-07 DIAGNOSIS — Z950 Presence of cardiac pacemaker: Secondary | ICD-10-CM | POA: Diagnosis not present

## 2019-02-07 DIAGNOSIS — S7002XA Contusion of left hip, initial encounter: Secondary | ICD-10-CM | POA: Diagnosis not present

## 2019-02-07 DIAGNOSIS — Y999 Unspecified external cause status: Secondary | ICD-10-CM | POA: Insufficient documentation

## 2019-02-07 DIAGNOSIS — Y939 Activity, unspecified: Secondary | ICD-10-CM | POA: Insufficient documentation

## 2019-02-07 DIAGNOSIS — I10 Essential (primary) hypertension: Secondary | ICD-10-CM | POA: Insufficient documentation

## 2019-02-07 DIAGNOSIS — S79912A Unspecified injury of left hip, initial encounter: Secondary | ICD-10-CM | POA: Diagnosis present

## 2019-02-07 DIAGNOSIS — F17220 Nicotine dependence, chewing tobacco, uncomplicated: Secondary | ICD-10-CM | POA: Diagnosis not present

## 2019-02-07 DIAGNOSIS — Z79899 Other long term (current) drug therapy: Secondary | ICD-10-CM | POA: Diagnosis not present

## 2019-02-07 DIAGNOSIS — W010XXA Fall on same level from slipping, tripping and stumbling without subsequent striking against object, initial encounter: Secondary | ICD-10-CM | POA: Insufficient documentation

## 2019-02-07 DIAGNOSIS — Y929 Unspecified place or not applicable: Secondary | ICD-10-CM | POA: Diagnosis not present

## 2019-02-07 DIAGNOSIS — Z96642 Presence of left artificial hip joint: Secondary | ICD-10-CM | POA: Insufficient documentation

## 2019-02-07 HISTORY — DX: Other chronic pain: G89.29

## 2019-02-07 HISTORY — DX: Malingerer (conscious simulation): Z76.5

## 2019-02-07 MED ORDER — ACETAMINOPHEN 500 MG PO TABS
1000.0000 mg | ORAL_TABLET | Freq: Once | ORAL | Status: AC
  Administered 2019-02-07: 1000 mg via ORAL
  Filled 2019-02-07: qty 2

## 2019-02-07 MED ORDER — KETOROLAC TROMETHAMINE 60 MG/2ML IM SOLN
60.0000 mg | Freq: Once | INTRAMUSCULAR | Status: AC
  Administered 2019-02-07: 23:00:00 60 mg via INTRAMUSCULAR
  Filled 2019-02-07: qty 2

## 2019-02-07 NOTE — ED Triage Notes (Signed)
Pt reports falling tonight and injuring left hip. Pt has chronic pain with several surgeries to left hip. Pt was ambulatory on scene per EMS.

## 2019-02-07 NOTE — ED Notes (Signed)
Patient transported to X-ray 

## 2019-02-07 NOTE — ED Notes (Signed)
ED Provider at bedside. 

## 2019-02-07 NOTE — ED Notes (Signed)
Pt requesting pain medication. Advised pt he was waiting on MD evaluation. Informed pt there were several pts ahead of him to be seen. Pt verbalized understanding.

## 2019-02-07 NOTE — ED Provider Notes (Signed)
[facility] EMERGENCY DEPARTMENT Provider Note   CSN: [id number] Arrival date & time: 02/07/19  2156     History   Chief Complaint Chief Complaint  Patient presents with  . Hip Pain    HPI Norwood Quezada is a 48 y.o. male.     HPI Patient with previous left hip replacement presents after fall from standing onto left hip this evening.  No head or neck injury.  Denies any new weakness or numbness.  Requesting "shot of morphine". Past Medical History:  Diagnosis Date  . Chronic left hip pain   . Drug-seeking behavior   . ETOH abuse   . Hypertension   . Kidney stones   . Left ventricular noncompaction cardiomyopathy [facility])     Patient Active Problem List   Diagnosis Date Noted  . Seborrheic keratosis 09/10/2010  . Cardiomyopathy 08/13/2010  . 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 Hypertension 08/13/2010  . Other and unspecified hyperlipidemia 08/13/2010  . Anxiety 08/13/2010  . Ankle pain 08/13/2010    Past Surgical History:  Procedure Laterality Date  . CARDIAC DEFIBRILLATOR PLACEMENT     x 2   . CARDIAC PACEMAKER PLACEMENT     x 2   . HIP SURGERY    . left knee surgery  2007  . PACEMAKER REVISION    . SHOULDER SURGERY    . skin grafts  2006   x 2         Home Medications    Prior to Admission medications   Medication Sig Start Date End Date Taking? Authorizing Provider  carvedilol (COREG) 25 MG tablet Take 25 mg by mouth 2 (two) times daily with a meal.     Yes [provider]  clonazePAM (KLONOPIN) 0.5 MG tablet Take by mouth.   Yes [provider]  lisinopril (PRINIVIL,ZESTRIL) 5 MG tablet Take 5 mg by mouth daily.     Yes [provider]  PARoxetine (PAXIL) 20 MG tablet Take 30 mg by mouth.    Yes [provider]  oxyCODONE (OXY IR/ROXICODONE) 5 MG immediate release tablet PLEASE SEE ATTACHED FOR DETAILED DIRECTIONS 12/28/18   [provider]    Family History  Family History  Problem Relation Age of Onset  . Bipolar disorder Brother   . Diabetes type II Father     Social History Social History   Tobacco Use  . Smoking status: Current Every Day Smoker  . Smokeless tobacco: Current User    Types: Snuff  Substance Use Topics  . Alcohol use: Yes    Frequency: Never  . Drug use: No     Allergies   Patient has no known allergies.   Review of Systems Review of Systems  Musculoskeletal: Positive for arthralgias. Negative for back pain and neck pain.  Skin: Negative for wound.  Neurological: Negative for syncope, weakness, numbness and headaches.  All other systems reviewed and are negative.    Physical Exam Updated Vital Signs BP 130/86   Pulse 100   Temp 98.7 F (37.1 C) (Oral)   Resp 16   Ht 5\' 8"  (1.727 m)   Wt 108.9 kg   SpO2 98%   BMI 36.49 kg/m   Physical Exam Vitals signs and nursing note reviewed.  Constitutional:      General: He is not in acute distress.    Appearance: Normal appearance. He is well-developed. He is not ill-appearing.  HENT:     Head: Normocephalic and atraumatic.  Eyes:     Pupils: Pupils are equal, round, and reactive to light.  Neck:     Musculoskeletal: Normal range of motion and neck supple.  Cardiovascular:     Rate and Rhythm: Normal rate.  Pulmonary:     Effort: Pulmonary effort is normal.  Abdominal:     Palpations: Abdomen is soft.     Tenderness: There is no guarding.  Musculoskeletal: Normal range of motion.        General: Tenderness present.     Comments: Mild tenderness to palpation over the lateral left hip.  Patient has full range of motion of the hip.  There is no lower extremity shortening or rotation.  2+ dorsalis pedis and posterior tibial pulses.  Skin:    General: Skin is warm and dry.     Findings: No erythema or rash.  Neurological:     General: No focal deficit present.     Mental Status: He is alert and oriented to person, place, and time.     Comments:  Sensation intact.  5/5 motor in all extremities.  There is some limitation of movement of the left hip due to pain.  Psychiatric:        Behavior: Behavior normal.      ED Treatments / Results  Labs (all labs ordered are listed, but only abnormal results are displayed) Labs Reviewed - No data to display  EKG None  Radiology Dg Hip Unilat W Or Wo Pelvis 2-3 Views Left  Result Date: 02/07/2019 CLINICAL DATA:  Fall, left hip pain EXAM: DG HIP (WITH OR WITHOUT PELVIS) 2-3V LEFT COMPARISON:  6-20 FINDINGS: Prior left hip replacement. No hardware or bony complicating feature. No acute bony abnormality. Early degenerative changes in the right hip. IMPRESSION: Left hip replacement. No hardware or bony complicating feature. No acute bony abnormality. Electronically Signed   By: Rolm Baptise M.D.   On: 02/07/2019 22:24    Procedures Procedures (including critical care time)  Medications Ordered in ED Medications  ketorolac (TORADOL) injection 60 mg (has no administration in time range)  acetaminophen (TYLENOL) tablet 1,000 mg (has no administration in time range)     Initial Impression / Assessment and Plan / ED Course  I have reviewed the triage vital signs and the nursing notes.  Pertinent labs & imaging results that were available during my care of the patient were reviewed by me and considered in my medical decision making (see chart for details).       Offered Toradol and Tylenol.  X-rays without concerning findings.  Advise follow-up with his orthopedist.  Return precautions given.   Final Clinical Impressions(s) / ED Diagnoses   Final diagnoses:  Contusion of left hip, initial encounter    ED Discharge Orders    None       Julianne Rice, MD 02/07/19 2231

## 2019-02-08 ENCOUNTER — Other Ambulatory Visit: Payer: Self-pay

## 2019-02-08 ENCOUNTER — Emergency Department (HOSPITAL_COMMUNITY)
Admission: EM | Admit: 2019-02-08 | Discharge: 2019-02-08 | Disposition: A | Discharge status: Home / Self Care | Payer: No Typology Code available for payment source | Attending: Emergency Medicine | Admitting: Emergency Medicine

## 2019-02-08 ENCOUNTER — Encounter (HOSPITAL_COMMUNITY): Payer: Self-pay | Admitting: Emergency Medicine

## 2019-02-08 DIAGNOSIS — F10929 Alcohol use, unspecified with intoxication, unspecified: Secondary | ICD-10-CM | POA: Diagnosis present

## 2019-02-08 DIAGNOSIS — Z5321 Procedure and treatment not carried out due to patient leaving prior to being seen by health care provider: Secondary | ICD-10-CM | POA: Diagnosis not present

## 2019-02-08 NOTE — ED Triage Notes (Signed)
Pt presents by [facility] for evaluation of left hip pain and intoxication.

## 2019-02-08 NOTE — ED Notes (Signed)
Security advised pt was having someone come to take him home. Security advised pt was cursing at staff at registration.

## 2019-03-30 ENCOUNTER — Emergency Department (HOSPITAL_COMMUNITY): Payer: No Typology Code available for payment source

## 2019-03-30 ENCOUNTER — Encounter (HOSPITAL_COMMUNITY): Payer: Self-pay | Admitting: Emergency Medicine

## 2019-03-30 ENCOUNTER — Other Ambulatory Visit: Payer: Self-pay

## 2019-03-30 ENCOUNTER — Emergency Department (HOSPITAL_COMMUNITY)
Admission: EM | Admit: 2019-03-30 | Discharge: 2019-03-30 | Disposition: A | Discharge status: Home / Self Care | Payer: No Typology Code available for payment source | Attending: Emergency Medicine | Admitting: Emergency Medicine

## 2019-03-30 DIAGNOSIS — R0789 Other chest pain: Secondary | ICD-10-CM | POA: Diagnosis not present

## 2019-03-30 DIAGNOSIS — R05 Cough: Secondary | ICD-10-CM | POA: Insufficient documentation

## 2019-03-30 DIAGNOSIS — R1010 Upper abdominal pain, unspecified: Secondary | ICD-10-CM | POA: Insufficient documentation

## 2019-03-30 DIAGNOSIS — F17210 Nicotine dependence, cigarettes, uncomplicated: Secondary | ICD-10-CM | POA: Insufficient documentation

## 2019-03-30 DIAGNOSIS — Z950 Presence of cardiac pacemaker: Secondary | ICD-10-CM | POA: Diagnosis not present

## 2019-03-30 DIAGNOSIS — Z79899 Other long term (current) drug therapy: Secondary | ICD-10-CM | POA: Insufficient documentation

## 2019-03-30 DIAGNOSIS — I10 Essential (primary) hypertension: Secondary | ICD-10-CM | POA: Insufficient documentation

## 2019-03-30 DIAGNOSIS — R058 Other specified cough: Secondary | ICD-10-CM

## 2019-03-30 DIAGNOSIS — Z20828 Contact with and (suspected) exposure to other viral communicable diseases: Secondary | ICD-10-CM | POA: Diagnosis not present

## 2019-03-30 LAB — COMPREHENSIVE METABOLIC PANEL
ALT: 26 U/L (ref 0–44)
AST: 23 U/L (ref 15–41)
Albumin: 4.3 g/dL (ref 3.5–5.0)
Alkaline Phosphatase: 104 U/L (ref 38–126)
Anion gap: 11 (ref 5–15)
BUN: 14 mg/dL (ref 6–20)
CO2: 23 mmol/L (ref 22–32)
Calcium: 9.5 mg/dL (ref 8.9–10.3)
Chloride: 105 mmol/L (ref 98–111)
Creatinine, Ser: 1.05 mg/dL (ref 0.61–1.24)
GFR calc Af Amer: >60 mL/min (ref >60)
GFR calc non Af Amer: >60 mL/min (ref >60)
Glucose, Bld: 113 mg/dL — ABNORMAL HIGH (ref 70–99)
Potassium: 4.4 mmol/L (ref 3.5–5.1)
Sodium: 139 mmol/L (ref 135–145)
Total Bilirubin: 1.2 mg/dL (ref 0.3–1.2)
Total Protein: 7.4 g/dL (ref 6.5–8.1)

## 2019-03-30 LAB — URINALYSIS, ROUTINE W REFLEX MICROSCOPIC
Bilirubin Urine: NEGATIVE
Glucose, UA: NEGATIVE mg/dL
Ketones, ur: NEGATIVE mg/dL
Leukocytes,Ua: NEGATIVE
Nitrite: NEGATIVE
Protein, ur: NEGATIVE mg/dL
Specific Gravity, Urine: 1.017 (ref 1.005–1.030)
pH: 5 (ref 5.0–8.0)

## 2019-03-30 LAB — CBC
HCT: 49.7 % (ref 39.0–52.0)
Hemoglobin: 16.2 g/dL (ref 13.0–17.0)
MCH: 29 pg (ref 26.0–34.0)
MCHC: 32.6 g/dL (ref 30.0–36.0)
MCV: 88.9 fL (ref 80.0–100.0)
Platelets: 291 10*3/uL (ref 150–400)
RBC: 5.59 MIL/uL (ref 4.22–5.81)
RDW: 13.1 % (ref 11.5–15.5)
WBC: 7.4 10*3/uL (ref 4.0–10.5)
nRBC: 0 % (ref 0.0–0.2)

## 2019-03-30 LAB — SARS CORONAVIRUS 2 (TAT 6-24 HRS): SARS Coronavirus 2: NEGATIVE

## 2019-03-30 LAB — TROPONIN I (HIGH SENSITIVITY): Troponin I (High Sensitivity): 3 ng/L (ref <18)

## 2019-03-30 LAB — BRAIN NATRIURETIC PEPTIDE: B Natriuretic Peptide: 23.8 pg/mL (ref 0.0–100.0)

## 2019-03-30 LAB — LIPASE, BLOOD: Lipase: 28 U/L (ref 11–51)

## 2019-03-30 MED ORDER — ALUM & MAG HYDROXIDE-SIMETH 200-200-20 MG/5ML PO SUSP
15.0000 mL | Freq: Once | ORAL | Status: AC
  Administered 2019-03-30: 15 mL via ORAL
  Filled 2019-03-30: qty 30

## 2019-03-30 MED ORDER — ACETAMINOPHEN 500 MG PO TABS
1000.0000 mg | ORAL_TABLET | Freq: Once | ORAL | Status: AC
  Administered 2019-03-30: 1000 mg via ORAL
  Filled 2019-03-30: qty 2

## 2019-03-30 MED ORDER — FAMOTIDINE 20 MG PO TABS
20.0000 mg | ORAL_TABLET | Freq: Once | ORAL | Status: AC
  Administered 2019-03-30: 20 mg via ORAL
  Filled 2019-03-30: qty 1

## 2019-03-30 NOTE — ED Provider Notes (Signed)
[facility]   CSN: [id number] Arrival date & time: 03/30/19  [id number]     History   Chief Complaint Chief Complaint  Patient presents with   Abdominal Pain    HPI Terry Mcconnell is a 48 y.o. male.     Patient c/o generally not feeling well for the past 2 weeks. Notes mid chest tightness sensation for past week, also c/o gnawing/gurgling sensation in epigastric area. Symptoms gradual onset, moderate, persistent, constant, non radiating. No specific exacerbating or alleviating factors. Occur at rest, not associated with exertion, position or eating. Denies fevers. +non prod cough. No sore throat. No known covid + exposure. Denies hx cad, or fam hx premature cad. Does note hx chf. States compliant w meds. No leg pain or increased swelling. No orthopnea/pnd. Denies dysuria or hematuria. States stools slightly loose compared to normal, had bm today. Denies wt change. No change in meds or new meds. No fever or chills. States he called [facility] doctors at [facility], and was told to go to ER.  The history is provided by the patient.  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 Authorizing Provider  carvedilol (COREG) 25 MG tablet Take 25 mg by mouth 2 (two) times daily with a meal.      [provider]  clonazePAM (KLONOPIN) 0.5 MG tablet Take by mouth.    [provider]  lisinopril (PRINIVIL,ZESTRIL) 5 MG tablet Take 5 mg by mouth daily.      [provider]  oxyCODONE (OXY IR/ROXICODONE) 5 MG immediate release tablet PLEASE SEE ATTACHED FOR DETAILED DIRECTIONS 12/28/18   [provider]  PARoxetine (PAXIL) 20 MG tablet Take 30 mg by mouth.     [provider]    Family History Family History  Problem Relation Age of Onset   Bipolar disorder Brother    Diabetes type II Father     Social History Social History   Tobacco Use   Smoking status: Current Every Day Smoker   Smokeless tobacco: Current User    Types: Snuff  Substance Use Topics   Alcohol use: Yes    Frequency: Never   Drug use: No     Allergies   Patient has no known allergies.  Review of Systems Review of Systems  Constitutional: Negative for fever.  HENT: Negative for sore throat.   Eyes: Negative for pain, redness and visual disturbance.  Respiratory: Positive for cough. Negative for shortness of breath.   Cardiovascular: Positive for chest pain. Negative for palpitations and leg swelling.  Gastrointestinal: Positive for abdominal pain. Negative for vomiting.  Endocrine: Negative for polyuria.  Genitourinary: Negative for dysuria and flank pain.  Musculoskeletal: Negative for back pain, neck pain and neck stiffness.  Skin: Negative for rash.  Neurological: Negative for numbness and headaches.  Hematological: Does not bruise/bleed easily.  Psychiatric/Behavioral: Negative for confusion.     Physical  Exam Updated Vital Signs BP (!) 146/111    Pulse 76    Temp 99.1 F (37.3 C) (Oral)    Resp 17    SpO2 99%   Physical Exam Vitals signs and nursing note reviewed.  Constitutional:      Appearance: Normal appearance. He is well-developed.  HENT:     Head: Atraumatic.     Nose: Nose normal.     Mouth/Throat:     Mouth: Mucous membranes are moist.     Pharynx: Oropharynx is clear.  Eyes:     General: No scleral icterus.    Conjunctiva/sclera: Conjunctivae normal.     Pupils: Pupils are equal, round, and reactive to light.  Neck:     Musculoskeletal: Normal range of motion and neck supple. No neck rigidity.     Trachea: No tracheal deviation.     Comments: No stiffness or rigidity. Trachea midline. Thyroid not grossly enlarged or tender.  Cardiovascular:     Rate and Rhythm: Normal rate and regular rhythm.     Pulses: Normal pulses.     Heart sounds: Normal heart sounds. No murmur. No friction rub. No gallop.   Pulmonary:     Effort: Pulmonary effort is normal. No accessory muscle usage or respiratory distress.     Breath sounds: Normal breath sounds.  Abdominal:     General: Bowel sounds are normal. There is no distension.     Palpations: Abdomen is soft. There is no mass.     Tenderness: There is no abdominal tenderness. There is no guarding or rebound.     Hernia: No hernia is present.  Genitourinary:    Comments: No cva tenderness. Musculoskeletal:        General: No swelling or tenderness.  Lymphadenopathy:     Cervical: No cervical adenopathy.  Skin:    General: Skin is warm and dry.     Findings: No rash.  Neurological:     Mental Status: He is alert.     Comments: Alert, speech clear. Motor/sens grossly intact bil. Steady gait.   Psychiatric:        Mood and Affect: Mood normal.      ED Treatments / Results  Labs (all labs ordered are listed, but only abnormal results are displayed) Results for orders placed or performed during the hospital encounter of  03/30/19  CBC  Result Value Ref Range   WBC 7.4 4.0 - 10.5 K/uL   RBC 5.59 4.22 - 5.81 MIL/uL   Hemoglobin 16.2 13.0 - 17.0 g/dL   HCT 42.3 53.6 - 14.4 %   MCV 88.9 80.0 - 100.0 fL   MCH 29.0 26.0 - 34.0 pg   MCHC 32.6 30.0 - 36.0 g/dL   RDW 31.5 40.0 - 86.7 %   Platelets 291 150 - 400 K/uL   nRBC 0.0 0.0 -  0.2 %  CMET  Result Value Ref Range   Sodium 139 135 - 145 mmol/L   Potassium 4.4 3.5 - 5.1 mmol/L   Chloride 105 98 - 111 mmol/L   CO2 23 22 - 32 mmol/L   Glucose, Bld 113 (H) 70 - 99 mg/dL   BUN 14 6 - 20 mg/dL   Creatinine, Ser 8.24 0.61 - 1.24 mg/dL   Calcium 9.5 8.9 - 23.5 mg/dL   Total Protein 7.4 6.5 - 8.1 g/dL   Albumin 4.3 3.5 - 5.0 g/dL   AST 23 15 - 41 U/L   ALT 26 0 - 44 U/L   Alkaline Phosphatase 104 38 - 126 U/L   Total Bilirubin 1.2 0.3 - 1.2 mg/dL   GFR calc non Af Amer >60 >60 mL/min   GFR calc Af Amer >60 >60 mL/min   Anion gap 11 5 - 15  Lipase  Result Value Ref Range   Lipase 28 11 - 51 U/L  Brain natriuretic peptide  Result Value Ref Range   B Natriuretic Peptide 23.8 0.0 - 100.0 pg/mL  Urinalysis, Routine w reflex microscopic  Result Value Ref Range   Color, Urine YELLOW YELLOW   APPearance CLEAR CLEAR   Specific Gravity, Urine 1.017 1.005 - 1.030   pH 5.0 5.0 - 8.0   Glucose, UA NEGATIVE NEGATIVE mg/dL   Hgb urine dipstick SMALL (A) NEGATIVE   Bilirubin Urine NEGATIVE NEGATIVE   Ketones, ur NEGATIVE NEGATIVE mg/dL   Protein, ur NEGATIVE NEGATIVE mg/dL   Nitrite NEGATIVE NEGATIVE   Leukocytes,Ua NEGATIVE NEGATIVE   RBC / HPF 0-5 0 - 5 RBC/hpf   WBC, UA 0-5 0 - 5 WBC/hpf   Bacteria, UA RARE (A) NONE SEEN   Squamous Epithelial / LPF 0-5 0 - 5   Mucus PRESENT   Troponin I (High Sensitivity)  Result Value Ref Range   Troponin I (High Sensitivity) 3 <18 ng/L   Xr Abd Acute Series  Result Date: 03/30/2019 CLINICAL DATA:  Shortness of breath. Additional history provided: Chest tightness, abdominal pain, dark urine. EXAM: DG ABDOMEN  ACUTE W/ 1V CHEST COMPARISON:  Chest radiograph 06/05/2018, CT abdomen/pelvis 01/30/2018 FINDINGS: Redemonstrated left chest pacer/AICD device. Status post left hip total arthroplasty. Stable cardiomegaly. No focal consolidation within the lungs. No evidence of pleural effusion or pneumothorax. No dilated loops of bowel are demonstrated to suggest obstruction. No evidence of free air or abnormal air-fluid levels. No definite radiopaque calculus identified. No acute bony abnormality. IMPRESSION: Stable cardiomegaly.  No airspace consolidation within the lungs. Nonobstructive bowel gas pattern. Electronically Signed   By: Jackey Loge DO   On: 03/30/2019 10:39    EKG EKG Interpretation  Date/Time:  Friday March 30 2019 10:06:36 EST Ventricular Rate:  76 PR Interval:  172 QRS Duration: 94 QT Interval:  358 QTC Calculation: 402 R Axis:   61 Text Interpretation: Normal sinus rhythm Nonspecific T wave abnormality Confirmed by Cathren Laine ([id number]) on 03/30/2019 10:26:18 AM   Radiology Xr Abd Acute Series  Result Date: 03/30/2019 CLINICAL DATA:  Shortness of breath. Additional history provided: Chest tightness, abdominal pain, dark urine. EXAM: DG ABDOMEN ACUTE W/ 1V CHEST COMPARISON:  Chest radiograph 06/05/2018, CT abdomen/pelvis 01/30/2018 FINDINGS: Redemonstrated left chest pacer/AICD device. Status post left hip total arthroplasty. Stable cardiomegaly. No focal consolidation within the lungs. No evidence of pleural effusion or pneumothorax. No dilated loops of bowel are demonstrated to suggest obstruction. No evidence of free air or abnormal air-fluid levels. No  definite radiopaque calculus identified. No acute bony abnormality. IMPRESSION: Stable cardiomegaly.  No airspace consolidation within the lungs. Nonobstructive bowel gas pattern. Electronically Signed   By: Kellie Simmering DO   On: 03/30/2019 10:39    Procedures Procedures (including critical care time)  Medications Ordered in  ED Medications - No data to display   Initial Impression / Assessment and Plan / ED Course  I have reviewed the triage vital signs and the nursing notes.  Pertinent labs & imaging results that were available during my care of the patient were reviewed by me and considered in my medical decision making (see chart for details).  Labs sent and imaging ordered.   Reviewed nursing notes and prior charts for additional history.   Labs reviewed/interpreted by me - trop normal. After symptoms constant/present for past few days, trop normal - felt not c/w acs. covid is pending.  Terry Mcconnell was evaluated in Emergency Department on 03/30/2019 for the symptoms described in the history of present illness. He was evaluated in the context of the global COVID-19 pandemic, which necessitated consideration that the patient might be at risk for infection with the SARS-CoV-2 virus that causes COVID-19. Institutional protocols and algorithms that pertain to the evaluation of patients at risk for COVID-19 are in a state of rapid change based on information released by regulatory bodies including the CDC and federal and state organizations. These policies and algorithms were followed during the patient's care in the ED.   Xrays reviewed/interpreted by me - no sbo. No pna.   Patient currently comfortable, no nv, no chest pain, no increased wob.  Pt appears stable for d/c.   rec close outpt pcp f/u. Given atypical cp, will also rec cardiology f/u.  Return precautions provided.     Final Clinical Impressions(s) / ED Diagnoses   Final diagnoses:  None    ED Discharge Orders    None       Lajean Saver, MD 03/30/19 1319

## 2019-03-30 NOTE — ED Triage Notes (Signed)
Pt endorses abd pain, chest tightness and some SOB. Hx of CHF. Reports dark urine.

## 2019-03-30 NOTE — Discharge Instructions (Addendum)
It was our pleasure to provide your ER care today - we hope that you feel better.  Your covid test should be resulted tomorrow - check My Chart or call for results.   For upper abdominal symptoms, you may try taking pepcid along with gas-x or maalox for symptom relief. Follow up with primary care doctor in the next 1-2 weeks.  Also your blood pressure is high today -  continue medication and follow up closely with your doctor.   For chest discomfort, follow up with cardiologist in 1 week - call office to arrange appointment.   Return to ER if worse, new symptoms, severe abdominal pain, persistent vomiting, recurrent or persistent chest pain, trouble breathing, or other concern.

## 2019-04-15 ENCOUNTER — Other Ambulatory Visit: Payer: Self-pay

## 2019-04-15 ENCOUNTER — Observation Stay (HOSPITAL_COMMUNITY)
Admission: RE | Admit: 2019-04-15 | Discharge: 2019-04-15 | Disposition: A | Discharge status: Home / Self Care | Payer: No Typology Code available for payment source | Attending: Psychiatry | Admitting: Psychiatry

## 2019-04-15 ENCOUNTER — Encounter (HOSPITAL_COMMUNITY): Payer: Self-pay | Admitting: Emergency Medicine

## 2019-04-15 DIAGNOSIS — F332 Major depressive disorder, recurrent severe without psychotic features: Secondary | ICD-10-CM | POA: Diagnosis not present

## 2019-04-15 DIAGNOSIS — I110 Hypertensive heart disease with heart failure: Secondary | ICD-10-CM | POA: Insufficient documentation

## 2019-04-15 DIAGNOSIS — Z96642 Presence of left artificial hip joint: Secondary | ICD-10-CM | POA: Insufficient documentation

## 2019-04-15 DIAGNOSIS — Z79899 Other long term (current) drug therapy: Secondary | ICD-10-CM | POA: Insufficient documentation

## 2019-04-15 DIAGNOSIS — F17290 Nicotine dependence, other tobacco product, uncomplicated: Secondary | ICD-10-CM | POA: Insufficient documentation

## 2019-04-15 DIAGNOSIS — Z79891 Long term (current) use of opiate analgesic: Secondary | ICD-10-CM | POA: Insufficient documentation

## 2019-04-15 DIAGNOSIS — F411 Generalized anxiety disorder: Secondary | ICD-10-CM | POA: Insufficient documentation

## 2019-04-15 DIAGNOSIS — F4310 Post-traumatic stress disorder, unspecified: Secondary | ICD-10-CM | POA: Insufficient documentation

## 2019-04-15 DIAGNOSIS — I509 Heart failure, unspecified: Secondary | ICD-10-CM | POA: Insufficient documentation

## 2019-04-15 DIAGNOSIS — Z20828 Contact with and (suspected) exposure to other viral communicable diseases: Secondary | ICD-10-CM | POA: Insufficient documentation

## 2019-04-15 DIAGNOSIS — Z950 Presence of cardiac pacemaker: Secondary | ICD-10-CM | POA: Insufficient documentation

## 2019-04-15 LAB — COMPREHENSIVE METABOLIC PANEL
ALT: 50 U/L — ABNORMAL HIGH (ref 0–44)
AST: 30 U/L (ref 15–41)
Albumin: 4.3 g/dL (ref 3.5–5.0)
Alkaline Phosphatase: 113 U/L (ref 38–126)
Anion gap: 8 (ref 5–15)
BUN: 16 mg/dL (ref 6–20)
CO2: 27 mmol/L (ref 22–32)
Calcium: 9.3 mg/dL (ref 8.9–10.3)
Chloride: 104 mmol/L (ref 98–111)
Creatinine, Ser: 1.21 mg/dL (ref 0.61–1.24)
GFR calc Af Amer: >60 mL/min (ref >60)
GFR calc non Af Amer: >60 mL/min (ref >60)
Glucose, Bld: 98 mg/dL (ref 70–99)
Potassium: 4.5 mmol/L (ref 3.5–5.1)
Sodium: 139 mmol/L (ref 135–145)
Total Bilirubin: 0.8 mg/dL (ref 0.3–1.2)
Total Protein: 7.7 g/dL (ref 6.5–8.1)

## 2019-04-15 LAB — CBC
HCT: 49.9 % (ref 39.0–52.0)
Hemoglobin: 16 g/dL (ref 13.0–17.0)
MCH: 28.8 pg (ref 26.0–34.0)
MCHC: 32.1 g/dL (ref 30.0–36.0)
MCV: 89.9 fL (ref 80.0–100.0)
Platelets: 259 10*3/uL (ref 150–400)
RBC: 5.55 MIL/uL (ref 4.22–5.81)
RDW: 13.2 % (ref 11.5–15.5)
WBC: 9.1 10*3/uL (ref 4.0–10.5)
nRBC: 0 % (ref 0.0–0.2)

## 2019-04-15 LAB — SARS CORONAVIRUS 2 BY RT PCR (HOSPITAL ORDER, PERFORMED IN ~~LOC~~ HOSPITAL LAB): SARS Coronavirus 2: NEGATIVE

## 2019-04-15 MED ORDER — ALUM & MAG HYDROXIDE-SIMETH 200-200-20 MG/5ML PO SUSP: 30.0000 mL | ORAL | Status: DC | PRN

## 2019-04-15 MED ORDER — HYDROXYZINE HCL 25 MG PO TABS
25.0000 mg | ORAL_TABLET | Freq: Three times a day (TID) | ORAL | Status: DC | PRN
  Administered 2019-04-15: 25 mg via ORAL
  Filled 2019-04-15: qty 1

## 2019-04-15 MED ORDER — CLONAZEPAM 0.5 MG PO TABS
0.5000 mg | ORAL_TABLET | Freq: Once | ORAL | Status: AC
  Administered 2019-04-15: 0.5 mg via ORAL
  Filled 2019-04-15: qty 1

## 2019-04-15 MED ORDER — PAROXETINE HCL 10 MG PO TABS
30.0000 mg | ORAL_TABLET | Freq: Every day | ORAL | Status: DC
  Administered 2019-04-15: 30 mg via ORAL
  Filled 2019-04-15: qty 3

## 2019-04-15 MED ORDER — CARVEDILOL 12.5 MG PO TABS
25.0000 mg | ORAL_TABLET | Freq: Two times a day (BID) | ORAL | Status: DC
  Administered 2019-04-15: 25 mg via ORAL
  Filled 2019-04-15: qty 2

## 2019-04-15 MED ORDER — OXYCODONE HCL 5 MG PO TABS
10.0000 mg | ORAL_TABLET | Freq: Once | ORAL | Status: AC
  Administered 2019-04-15: 10 mg via ORAL
  Filled 2019-04-15: qty 2

## 2019-04-15 MED ORDER — LISINOPRIL 2.5 MG PO TABS
5.0000 mg | ORAL_TABLET | Freq: Every day | ORAL | Status: DC
  Administered 2019-04-15: 5 mg via ORAL
  Filled 2019-04-15: qty 2

## 2019-04-15 MED ORDER — MAGNESIUM HYDROXIDE 400 MG/5ML PO SUSP: 30.0000 mL | Freq: Every day | ORAL | Status: DC | PRN

## 2019-04-15 MED ORDER — ACETAMINOPHEN 325 MG PO TABS: 650.0000 mg | ORAL_TABLET | Freq: Four times a day (QID) | ORAL | Status: DC | PRN

## 2019-04-15 NOTE — Progress Notes (Signed)
Patient discharged as recommended.Discharge instructions provided and patient verbalized understanding.  Belongings returned. Denies suicidal ideations upon discharge.

## 2019-04-15 NOTE — Discharge Summary (Addendum)
Physician Discharge Summary Note  Patient:  Terry Mcconnell is an 48 y.o., male MRN:  [id number] DOB:  06/30/1970 Patient phone:  [phone] (home)  Patient address:   [address] [place] [id number],  Total Time spent with patient: 15 minutes  Date of Admission:  04/15/2019 Date of Discharge: 04/15/2019  Reason for Admission:  Per admission assessment note: Terry Mcconnell an 48 y.o.divorcedmalewho presents unaccompanied [facility] voluntarily via Patent examinerlaw enforcement.Pt reports he has a 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 He says "this is a bad time of year for me" and that he called law enforcement because he felt depressed, anxious and wanted to talk to someone. Pt says he was crying uncontrollably yesterday and was upset again today. He says his anxiety is "through the roof" and reports daily panic attacks, stating "I feel like I'm having a heart attack every day."Pt acknowledges symptoms including crying spells, social withdrawal, loss of interest in usual pleasures, fatigue, irritability,anddecreased sleep. He denies current suicidal ideation or history of suicide attempts.Pt denies any history of intentional self-injurious behaviors. Pt denies current homicidal ideation or history of violence. Pt denies any history of auditory or visual hallucinations.  Evaluation: Terry Mcconnell was seen and evaluated by NP's  and Attending Psychiatrist. Patient is awake, alert and oriented x3.  He is requesting to be discharged home.  Reports he currently resides with his ex-wife, stated he was admitted to the hospital because he needed "somebody to talk too".  Reports" November is a bad time of the year" for him due to his history of military service. Terry Mcconnell reports he was discontinued from his sleeping medication ( Klonopin) due to recent hip replacement surgery.  Patient reports he was hopeful to be restarted back on Klonopin to help with his sleep and he reported that  he is [facility]. Terry Mcconnell  states he has not been able to see a provider due to Covid and long wait time.  He is denying suicidal or homicidal ideations.  Denies auditory or visual hallucinations.  Support, encouragement and  reassurance was provided  Principal Problem: Severe recurrent major depression without psychotic features [facility](HCC) Discharge Diagnoses: Principal Problem:   Severe recurrent major depression without psychotic features [facility](HCC)   Past Psychiatric History:   Past Medical History:  Past Medical History:  Diagnosis Date  . Chronic left hip pain   . Drug-seeking behavior   . ETOH abuse   . Hypertension   . Kidney stones   . Left ventricular noncompaction cardiomyopathy [facility](HCC)     Past Surgical History:  Procedure Laterality Date  . CARDIAC DEFIBRILLATOR PLACEMENT     x 2   . CARDIAC PACEMAKER PLACEMENT     x 2   . HIP SURGERY    . left knee surgery  2007  . PACEMAKER REVISION    . SHOULDER SURGERY    . skin grafts  2006   x 2    Family History:  Family History  Problem Relation Age of Onset  . Bipolar disorder Brother   . Diabetes type II Father    Family Psychiatric  History:  Social History:  Social History   Substance and Sexual Activity  Alcohol Use Yes  . Frequency: Never     Social History   Substance and Sexual Activity  Drug Use No    Social History   Socioeconomic History  . Marital status: Divorced  Spouse name: Jill Side  . Number of children: no  . Years of education: Not on file  . Highest education level: Not on file  Occupational History  . Occupation: veteran  Social Needs  . Financial resource strain: Not on file  . Food insecurity    Worry: Not on file    Inability: Not on file  . Transportation needs    Medical: Not on file    Non-medical: Not on file  Tobacco Use  . Smoking status: Current Every Day Smoker  . Smokeless tobacco: Current User    Types: Snuff  Substance and Sexual Activity   . Alcohol use: Yes    Frequency: Never  . Drug use: No  . Sexual activity: Not on file  Lifestyle  . Physical activity    Days per week: Not on file    Minutes per session: Not on file  . Stress: Not on file  Relationships  . Social Musician on phone: Not on file    Gets together: Not on file    Attends religious service: Not on file    Active member of club or organization: Not on file    Attends meetings of clubs or organizations: Not on file    Relationship status: Not on file  Other Topics Concern  . Not on file  Social History Narrative   No regular exercise.     Hospital Course: Terry Mcconnell was admitted for Severe recurrent major depression without psychotic features (HCC)and crisis management.  Pt was treated discharged with the medications listed below under Medication List.  Medical problems were identified and treated as needed.  Home medications were restarted as appropriate.  Improvement was monitored by observation and Terry Mcconnell 's daily report of symptom reduction.  Emotional and mental status was monitored by daily self-inventory reports completed by Terry Mcconnell and clinical staff.         Adren Dollins was evaluated by the treatment team for stability and plans for continued recovery upon discharge. Terry Mcconnell 's motivation was an integral factor for scheduling further treatment. Employment, transportation, bed availability, health status, family support, and any pending legal issues were also considered during hospital stay. Pt was offered further treatment options upon discharge including but not limited to Residential, Intensive Outpatient, and Outpatient treatment.  Terry Mcconnell will follow up with the services as listed below under Follow Up Information.     Upon completion of this admission the patient was both mentally and medically stable for discharge denying suicidal/homicidal ideation, auditory/visual/tactile hallucinations, delusional  thoughts and paranoia.    Terry Mcconnell responded well to treatment with Paxil 30 mg and  Klonopin 0.5 mg  and without adverse effects.Pt demonstrated improvement without reported or observed adverse effects to the point of stability appropriate for outpatient management. Pertinent labs include: CMP,and CBC for which outpatient follow-up is necessary for lab recheck as mentioned below. Reviewed CBC, CMP, BAL, and UDS; all unremarkable aside from noted exceptions.   Physical Findings: AIMS: Facial and Oral Movements Muscles of Facial Expression: None, normal Lips and Perioral Area: None, normal Jaw: None, normal Tongue: None, normal,Extremity Movements Upper (arms, wrists, hands, fingers): None, normal Lower (legs, knees, ankles, toes): None, normal, Trunk Movements Neck, shoulders, hips: None, normal, Overall Severity Severity of abnormal movements (highest score from questions above): None, normal Incapacitation due to abnormal movements: None, normal Patient's awareness of abnormal movements (rate only patient's report): No Awareness, Dental Status Current problems with teeth and/or  dentures?: No Does patient usually wear dentures?: No  CIWA:  CIWA-Ar Total: 1 COWS:  COWS Total Score: 3  Musculoskeletal: Strength & Muscle Tone: within normal limits Gait & Station: Observed resting in bed Patient leans: N/A  Psychiatric Specialty Exam: Physical Exam  Constitutional: He appears well-developed.  Psychiatric: He has a normal mood and affect. His behavior is normal.    Review of Systems  Psychiatric/Behavioral: Positive for depression. Negative for suicidal ideas. The patient is nervous/anxious.   All other systems reviewed and are negative.   Blood pressure 129/86, pulse 72, temperature 98.2 F (36.8 C), resp. rate 20, SpO2 99 %.There is no height or weight on file to calculate BMI.  General Appearance: Casual  Eye Contact:  Fair  Speech:  Clear and Coherent  Volume:  Normal   Mood:  Anxious and Depressed  Affect:  Appropriate  Thought Process:  Coherent  Orientation:  Full (Time, Place, and Person)  Thought Content:  Logical  Suicidal Thoughts:  No  Homicidal Thoughts:  No  Memory:  Immediate;   Fair Recent;   Fair  Judgement:  Fair  Insight:  Fair  Psychomotor Activity:  Normal  Concentration:  Concentration: Fair  Recall:  [employer] of Knowledge:  Fair  Language:  Fair  Akathisia:  No  Handed:  Right  AIMS (if indicated):     Assets:  Communication Skills Desire for Improvement  ADL's:  Intact  Cognition:  WNL  Sleep:           Has this patient used any form of tobacco in the last 30 days? (Cigarettes, Smokeless Tobacco, Cigars, and/or Pipes) Yes, Yes, A prescription for an FDA-approved tobacco cessation medication was offered at discharge and the patient refused  Blood Alcohol level:  Lab Results  Component Value Date   [facility]  08/07/2010    6        LOWEST DETECTABLE LIMIT FOR SERUM ALCOHOL IS 5 mg/dL FOR MEDICAL PURPOSES ONLY    Metabolic Disorder Labs:  No results found for: HGBA1C, MPG No results found for: PROLACTIN No results found for: CHOL, TRIG, HDL, CHOLHDL, VLDL, LDLCALC  See Psychiatric Specialty Exam and Suicide Risk Assessment completed by Attending Physician prior to discharge.  Discharge destination:  Home  Is patient on multiple antipsychotic therapies at discharge:  No   Has Patient had three or more failed trials of antipsychotic monotherapy by history:  No  Recommended Plan for Multiple Antipsychotic Therapies: NA  Discharge Instructions    Diet - low sodium heart healthy   Complete by: As directed    Discharge instructions   Complete by: As directed    Increase activity slowly   Complete by: As directed      Allergies as of 04/15/2019   No Known Allergies     Medication List    TAKE these medications     Indication  carvedilol 25 MG tablet Commonly known as: COREG Take 25 mg by mouth 2 (two)  times daily with a meal.  Indication: High Blood Pressure Disorder   lisinopril 5 MG tablet Commonly known as: ZESTRIL Take 5 mg by mouth daily.  Indication: High Blood Pressure Disorder   oxyCODONE 5 MG immediate release tablet Commonly known as: Oxy IR/ROXICODONE Take 10 mg by mouth 2 (two) times daily as needed.  Indication: Chronic Pain   PARoxetine 20 MG tablet Commonly known as: PAXIL Take 30 mg by mouth.  Indication: Major Depressive Disorder  Follow-up recommendations:  Activity:  as tolerated Diet:  heart healthy  Comments:  Take all medications as prescribed. Keep all follow-up appointments as scheduled.  Do not consume alcohol or use illegal drugs while on prescription medications. Report any adverse effects from your medications to your primary care provider promptly.  In the event of recurrent symptoms or worsening symptoms, call 911, a crisis hotline, or go to the nearest emergency department for evaluation.   Signed: Oneta Rack, NP 04/15/2019, 10:31 AM   Patient seen face-to-face for psychiatric evaluation, chart reviewed and case discussed with the physician extender and developed treatment plan. Reviewed the information documented and agree with the treatment plan. Thedore Mins, MD

## 2019-04-15 NOTE — Progress Notes (Signed)
Patient ID: Terry Mcconnell, male   DOB: Sep 08, 1970, 48 y.o.   MRN: [id number] Pt A&O x 4, presents with depression and anxiety around the holidays, pt is a [employer], suffering from PTSD related to  His [place] who did  not make it back home, he reports.  Denies SI, HI or AVH.  Skin search completed.  Monitoring for safety.

## 2019-04-15 NOTE — BH Assessment (Signed)
Assessment Note  Terry Mcconnell is an 48 y.o. divorced male who presents unaccompanied to [facility] voluntarily via law enforcement. 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 He says "this is a bad time of year for me" and that he called law enforcement because he felt depressed, anxious and wanted to talk to someone. Pt says he was crying uncontrollably yesterday and was upset again today. He says his anxiety is "through the roof" and reports daily panic attacks, stating "I feel like I'm having a heart attack every day." Pt acknowledges symptoms including crying spells, social withdrawal, loss of interest in usual pleasures, fatigue, irritability, and decreased sleep. He denies current suicidal ideation or history of suicide attempts. Pt denies any history of intentional self-injurious behaviors. Pt denies current homicidal ideation or history of violence. Pt denies any history of auditory or visual hallucinations.   Pt reports he has a history of abusing alcohol. He says he was sober for four years but currently is drinking approximately 2 shots of liquor twice a week. He says he also uses marijuana 1-2 times per week. He denies other substance use. Pt reports he takes 2 tabs of Oxycodone 10 at night to help him sleep. He denies other substance use.  Pt reports he feels lonely and isolated. He says he has no family support because they don't understand what it's like to serve in the [employer]. He says he has a 55 year old child and their relationship is "horrible." He says he is currently residing with his ex-wife but he has a Biomedical scientist and is looking for an apartment. He says he has [place] friends who he communicates with through social media. He says he is trying to have his veteran's disability increased because he has medical problems including a recent hip replacement, CHF, pacemaker and knee problems. 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 He denies current legal problems. He denies history of childhood abuse. He says he does own a gun.  Pt reports he is receiving medication management through the [facility]. He says he is currently prescribed Paxil, Coreg, Lisinopril and Oxycodone. He says he stopped taking Klonopin in June because he had left hip replacement surgery. He says he has not been able to participate in outpatient therapy through the [place] due to COVID restrictions. He denies any history of inpatient psychiatric treatment.  Pt is dressed in bathrobe, shirt and sandals. He is alert and oriented x4. Pt speaks in a clear tone, at moderate volume and normal pace. Motor behavior appears normal. Eye contact is good. Pt's mood is depressed and anxious, affect is congruent with mood. Thought process is coherent and relevant. There is no indication Pt is currently responding to internal stimuli or experiencing delusional thought content. Pt was pleasant and cooperative throughout assessment.     Diagnosis:  F43.10 Posttraumatic stress disorder  Past Medical History:  Past Medical History:  Diagnosis Date  . Chronic left hip pain   . Drug-seeking behavior   . ETOH abuse   . Hypertension   . Kidney stones   . Left ventricular noncompaction cardiomyopathy [facility])     Past Surgical History:  Procedure Laterality Date  . CARDIAC DEFIBRILLATOR PLACEMENT     x 2   . CARDIAC PACEMAKER PLACEMENT     x 2   . HIP SURGERY    . left knee surgery  2007  . PACEMAKER REVISION    . SHOULDER SURGERY    . skin grafts  2006   x 2     Family History:  Family History  Problem Relation Age of Onset  . Bipolar disorder Brother   . Diabetes type II Father     Social History:  reports that he has been smoking. His smokeless tobacco use includes snuff. He reports current alcohol use. He reports that he does not use drugs.  Additional Social History:  Alcohol / Drug Use Pain Medications: Pt denies  abuse Prescriptions: Pt denies abuse Over the Counter: Pt denies abuse History of alcohol / drug use?: Yes Longest period of sobriety (when/how long): 4 years Negative Consequences of Use: Legal Substance #1 Name of Substance 1: Alcohol 1 - Age of First Use: Adolescent 1 - Amount (size/oz): 2 shots of liquor 1 - Frequency: 2 times per week 1 - Duration: Ongoing 1 - Last Use / Amount: 04/14/2019  CIWA: CIWA-Ar BP: 111/80 Pulse Rate: (!) 108 COWS:    Allergies: No Known Allergies  Home Medications:  Medications Prior to Admission  Medication Sig Dispense Refill  . carvedilol (COREG) 25 MG tablet Take 25 mg by mouth 2 (two) times daily with a meal.      . clonazePAM (KLONOPIN) 0.5 MG tablet Take by mouth.    Marland Kitchen lisinopril (PRINIVIL,ZESTRIL) 5 MG tablet Take 5 mg by mouth daily.      Marland Kitchen oxyCODONE (OXY IR/ROXICODONE) 5 MG immediate release tablet PLEASE SEE ATTACHED FOR DETAILED DIRECTIONS    . PARoxetine (PAXIL) 20 MG tablet Take 30 mg by mouth.       OB/GYN Status:  No LMP for male patient.  General Assessment Data Location of Assessment: [facility] Services TTS Assessment: In system Is this a Tele or Face-to-Face Assessment?: Face-to-Face Is this an Initial Assessment or a Re-assessment for this encounter?: Initial Assessment Patient Accompanied by:: N/A Language Other than English: No Living Arrangements: Other (Comment)(Staying with ex-wife) What gender do you identify as?: Male Marital status: Divorced [name] name: NA Pregnancy Status: No Living Arrangements: Other (Comment)(Staying with ex-wife) Can pt return to current living arrangement?: Yes Admission Status: Voluntary Is patient capable of signing voluntary admission?: Yes Referral Source: Self/Family/Friend Insurance type: VA Benefits  Medical Screening Exam [facility] Walk-in ONLY) Medical Exam completed: Yes(Jason Allyson Sabal, FNP)  Crisis Care Plan Living Arrangements: Other (Comment)(Staying with  ex-wife) Legal Guardian: Other:(Self) Name of Psychiatrist: [facility] Name of Therapist: None  Education Status Is patient currently in school?: No Is the patient employed, unemployed or receiving disability?: Receiving disability income  Risk to self with the past 6 months Suicidal Ideation: No Has patient been a risk to self within the past 6 months prior to admission? : No Suicidal Intent: No Has patient had any suicidal intent within the past 6 months prior to admission? : No Is patient at risk for suicide?: No Suicidal Plan?: No Has patient had any suicidal plan within the past 6 months prior to admission? : No Access to Means: No What has been your use of drugs/alcohol within the last 12 months?: Pt reports a history of abusing alcohol Previous Attempts/Gestures: No How many times?: 0 Other Self Harm Risks: None Triggers for Past Attempts: None known Intentional Self Injurious Behavior: None Family Suicide History: Yes(Sister attempted suicide) Recent stressful life event(s): Financial Problems, Other (Comment)(Chronic medical problems) Persecutory voices/beliefs?: No Depression: Yes Depression Symptoms: Despondent, Tearfulness, Isolating, Fatigue, Loss of interest in usual pleasures,  Feeling angry/irritable Substance abuse history and/or treatment for substance abuse?: Yes Suicide prevention information given to non-admitted patients: Not applicable  Risk to Others within the past 6 months Homicidal Ideation: No Does patient have any lifetime risk of violence toward others beyond the six months prior to admission? : No Thoughts of Harm to Others: No Current Homicidal Intent: No Current Homicidal Plan: No Access to Homicidal Means: No Identified Victim: None History of harm to others?: No Assessment of Violence: None Noted Violent Behavior Description: Pt denies history of violence Does patient have access to weapons?: Yes (Comment)(Pt owns a gun) Criminal Charges  Pending?: No Does patient have a court date: No Is patient on probation?: No  Psychosis Hallucinations: None noted Delusions: None noted  Mental Status Report Appearance/Hygiene: Other (Comment)(wearing bathrobe and sandals) Eye Contact: Good Motor Activity: Unremarkable Speech: Logical/coherent Level of Consciousness: Alert Mood: Depressed, Anxious Affect: Depressed Anxiety Level: Panic Attacks Panic attack frequency: Daily Most recent panic attack: today Thought Processes: Coherent, Relevant Judgement: Unimpaired Orientation: Person, Place, Time, Situation Obsessive Compulsive Thoughts/Behaviors: None  Cognitive Functioning Concentration: Normal Memory: Recent Intact, Remote Intact Is patient IDD: No Insight: Fair Impulse Control: Fair Appetite: Good Have you had any weight changes? : No Change Sleep: Decreased Total Hours of Sleep: 5 Vegetative Symptoms: None  ADLScreening [facility] Assessment Services) Patient's cognitive ability adequate to safely complete daily activities?: Yes Patient able to express need for assistance with ADLs?: Yes Independently performs ADLs?: Yes (appropriate for developmental age)  Prior Inpatient Therapy Prior Inpatient Therapy: No  Prior Outpatient Therapy Prior Outpatient Therapy: Yes Prior Therapy Dates: Current Prior Therapy Facilty/Provider(s): [facility] Reason for Treatment: Medication management Does patient have an ACCT team?: No Does patient have Intensive In-House Services?  : No Does patient have [facility] services? : No Does patient have P4CC services?: No  ADL Screening (condition at time of admission) Patient's cognitive ability adequate to safely complete daily activities?: Yes Is the patient deaf or have difficulty hearing?: No Does the patient have difficulty seeing, even when wearing glasses/contacts?: No Does the patient have difficulty concentrating, remembering, or making decisions?: No Patient able to express  need for assistance with ADLs?: Yes Does the patient have difficulty dressing or bathing?: No Independently performs ADLs?: Yes (appropriate for developmental age) Does the patient have difficulty walking or climbing stairs?: No Weakness of Legs: None Weakness of Arms/Hands: None  Home Assistive Devices/Equipment Home Assistive Devices/Equipment: None    Abuse/Neglect Assessment (Assessment to be complete while patient is alone) Abuse/Neglect Assessment Can Be Completed: Yes Physical Abuse: Denies Verbal Abuse: Denies Sexual Abuse: Denies Exploitation of patient/patient's resources: Denies Self-Neglect: Denies     Merchant navy officer (For Healthcare) Does Patient Have a Medical Advance Directive?: No Would patient like information on creating a medical advance directive?: No - Patient declined          Disposition: Gave clinical report to Nira Conn, FNP who completed MSE and recommended Pt be admitted to observation unit and evaluated by psychiatry in the morning.  Disposition Initial Assessment Completed for this Encounter: Yes Disposition of Patient: (Observation unit) Patient refused recommended treatment: No  On Site Evaluation by:  Nira Conn, FNP Reviewed with Physician:    Pamalee Leyden, [facility] Triage Specialist [phone]  Patsy Baltimore, Harlin Rain 04/15/2019 12:07 AM

## 2019-04-15 NOTE — H&P (Signed)
[facility] Provider Admission PAA/H&P  Patient Identification: Terry Mcconnell MRN:  [id number] Date of Evaluation:  04/15/2019 Chief Complaint:  PTSD Principal Diagnosis: Severe recurrent major depression without psychotic features ([place]) Diagnosis:  Active Problems:   Severe recurrent major depression without psychotic features ([place])  History of Present Illness:  TTS Assessment:  Terry Mcconnell is an 48 y.o. divorced male who presents unaccompanied to [place] voluntarily via Event organiser. 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 He says "this is a bad time of year for me" and that he called law enforcement because he felt depressed, anxious and wanted to talk to someone. Pt says he was crying uncontrollably yesterday and was upset again today. He says his anxiety is "through the roof" and reports daily panic attacks, stating "I feel like I'm having a heart attack every day." Pt acknowledges symptoms including crying spells, social withdrawal, loss of interest in usual pleasures, fatigue, irritability, and decreased sleep. He denies current suicidal ideation or history of suicide attempts. Pt denies any history of intentional self-injurious behaviors. Pt denies current homicidal ideation or history of violence. Pt denies any history of auditory or visual hallucinations.   Pt reports he has a history of abusing alcohol. He says he was sober for four years but currently is drinking approximately 2 shots of liquor twice a week. He says he also uses marijuana 1-2 times per week. He denies other substance use. Pt reports he takes 2 tabs of Oxycodone 10 at night to help him sleep. He denies other substance use.  Pt reports he feels lonely and isolated. He says he has no family support because they don't understand what it's like to serve in the [employer]. He says he has a 74 year old child and their relationship is "horrible." He says he is currently residing  with his ex-wife but he has a Biomedical scientist and is looking for an apartment. He says he has [place] friends who he communicates with through social media. He says he is trying to have his veteran's disability increased because he has medical problems including a recent hip replacement, CHF, pacemaker and knee problems. 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 He denies current legal problems. He denies history of childhood abuse. He says he does own a gun.  Pt reports he is receiving medication management through the [facility]. He says he is currently prescribed Paxil, Coreg, Lisinopril and Oxycodone. He says he stopped taking Klonopin in June because he had left hip replacement surgery. He says he has not been able to participate in outpatient therapy through the [place] due to COVID restrictions. He denies any history of inpatient psychiatric treatment.   Evaluation on Unit: Reviewed TTS assessment and validated with patient. On evaluation patient is alert and oriented x 4, pleasant, and cooperative. Speech is clear and coherent. Mood is depressed/anxious and affect is congruent with mood. Thought process is coherent and thought content is logical. Reports suicidal thoughts without a plan/intnet. Denies homicidal ideations. Denies substance abuse. Denies audiovisual hallucinations. No indication that patient is responding to internal stimuli.   Patient mentions several times during assessment that he used to take clonazepam for anxiety and that it is the only thing that helps with his anxiety. States that he has not taken since starting oxycodone for hip pain/hip replacement. Patient appears to be med seeking.   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 Yes.    Has the patient been a risk to self in the past 6 months? No.  Has the patient been a risk to self within the distant past? Yes.    Is the patient a risk to others? No.  Has the patient been a risk to others in the past 6 months? No.  Has the patient been a risk to others within the distant past? No.   Prior Inpatient Therapy: Prior Inpatient Therapy: No Prior Outpatient Therapy: Prior Outpatient Therapy: Yes Prior Therapy Dates: Current Prior Therapy Facilty/Provider(s): [facility] Reason for Treatment: Medication management Does patient have an ACCT team?: No Does patient have Intensive In-House Services?  : No Does patient have [facility] services? : No Does patient have P4CC services?: No  Alcohol Screening:   Substance Abuse History in the last 12 months:  Yes.   Consequences of Substance Abuse: Negative Previous Psychotropic Medications: Yes  Psychological Evaluations: Yes  Past Medical History:  Past Medical History:  Diagnosis Date  . Chronic left hip pain   . Drug-seeking behavior   . ETOH abuse   . Hypertension   . Kidney stones   . Left ventricular noncompaction cardiomyopathy [facility](HCC)     Past Surgical History:  Procedure Laterality Date  . CARDIAC DEFIBRILLATOR PLACEMENT     x 2   . CARDIAC PACEMAKER PLACEMENT     x 2   . HIP SURGERY    . left knee surgery  2007  . PACEMAKER REVISION    . SHOULDER SURGERY    . skin grafts  2006   x 2    Family History:   Family History  Problem Relation Age of Onset  . Bipolar disorder Brother   . Diabetes type II Father    Family Psychiatric History: unknown Tobacco Screening:   Social History:  Social History   Substance and Sexual Activity  Alcohol Use Yes  . Frequency: Never     Social History   Substance and Sexual Activity  Drug Use No    Additional Social History: Marital status: Divorced    Pain Medications: Pt denies abuse Prescriptions: Pt denies abuse Over the Counter: Pt denies abuse History of alcohol / drug use?: Yes Longest period of sobriety (when/how long): 4 years Negative Consequences of Use: Legal Name of Substance 1: Alcohol 1 - Age of First Use: Adolescent 1 - Amount (size/oz): 2 shots of liquor 1 - Frequency: 2 times per week 1 - Duration: Ongoing 1 - Last Use / Amount: 04/14/2019                  Allergies:  No Known Allergies Lab Results: No results found for this or any previous visit (from the past 48 hour(s)).  Blood Alcohol level:  Lab Results  Component Value Date   [facility]  08/07/2010    6        LOWEST DETECTABLE LIMIT FOR SERUM ALCOHOL  IS 5 mg/dL FOR MEDICAL PURPOSES ONLY    Metabolic Disorder Labs:  No results found for: HGBA1C, MPG No results found for: PROLACTIN No results found for: CHOL, TRIG, HDL, CHOLHDL, VLDL, LDLCALC  Current Medications: No current facility-administered medications for this encounter.    PTA Medications: Medications Prior to Admission  Medication Sig Dispense Refill Last Dose  . carvedilol (COREG) 25 MG tablet Take 25 mg by mouth 2 (two) times daily with a meal.       . clonazePAM (KLONOPIN) 0.5 MG tablet Take by mouth.     Marland Kitchen lisinopril (PRINIVIL,ZESTRIL) 5 MG tablet Take 5 mg by mouth daily.       Marland Kitchen oxyCODONE (OXY IR/ROXICODONE) 5 MG immediate release tablet PLEASE SEE ATTACHED FOR DETAILED DIRECTIONS     . PARoxetine (PAXIL) 20 MG tablet Take 30 mg by mouth.        Musculoskeletal: Strength & Muscle  Tone: within normal limits Gait & Station: normal Patient leans: N/A  Psychiatric Specialty Exam: Physical Exam  Constitutional: He is oriented to person, place, and time. He appears well-developed and well-nourished. No distress.  HENT:  Head: Normocephalic and atraumatic.  Right Ear: External ear normal.  Left Ear: External ear normal.  Eyes: Pupils are equal, round, and reactive to light. Right eye exhibits no discharge. Left eye exhibits no discharge.  Respiratory: Effort normal. No respiratory distress.  Musculoskeletal: Normal range of motion.  Neurological: He is alert and oriented to person, place, and time.  Skin: He is not diaphoretic.  Psychiatric: His mood appears anxious. He is not withdrawn and not actively hallucinating. Thought content is not paranoid and not delusional. He expresses impulsivity and inappropriate judgment. He exhibits a depressed mood. He expresses suicidal ideation. He expresses no homicidal ideation. He expresses no suicidal plans.    Review of Systems  Constitutional: Negative for chills, diaphoresis, fever, malaise/fatigue and weight loss.  Respiratory: Negative for cough and shortness of breath.   Cardiovascular: Negative for chest pain.  Gastrointestinal: Negative for diarrhea, nausea and vomiting.  Musculoskeletal: Positive for joint pain.       Reports hip pain of 8/10, this is a recurrent problem  Psychiatric/Behavioral: Positive for depression and suicidal ideas. Negative for hallucinations and memory loss. The patient is nervous/anxious and has insomnia.     Blood pressure 111/80, pulse (!) 108, temperature 99.6 F (37.6 C), temperature source Oral, resp. rate 20, SpO2 96 %.There is no height or weight on file to calculate BMI.  General Appearance: Casual and Fairly Groomed  Eye Contact:  Good  Speech:  Clear and Coherent and Normal Rate  Volume:  Normal  Mood:  Anxious, Depressed, Hopeless and Worthless  Affect:  Congruent and Depressed   Thought Process:  Coherent, Goal Directed, Linear and Descriptions of Associations: Intact  Orientation:  Full (Time, Place, and Person)  Thought Content:  Logical and Hallucinations: None  Suicidal Thoughts:  Yes.  without intent/plan  Homicidal Thoughts:  No  Memory:  Immediate;   Good Recent;   Good Remote;   Good  Judgement:  Fair  Insight:  Fair  Psychomotor Activity:  Normal  Concentration:  Concentration: Good  Recall:  Good  Fund of Knowledge:  Good  Language:  Good  Akathisia:  Negative  Handed:  Right  AIMS (if indicated):     Assets:  Communication Skills Desire for Improvement Financial Resources/Insurance Housing Leisure Time Physical Health  ADL's:  Intact  Cognition:  WNL  Sleep:  Treatment Plan Summary: Daily contact with patient to assess and evaluate symptoms and progress in treatment and Medication management  Observation Level/Precautions:  15 minute checks Laboratory:  CBC Chemistry Profile UDS Psychotherapy:   Individual Medications:   Coreg 25 mg BID for CHF Lisinopril 5 mg daily for HTN Paxil 30 mg daily for anxiety/PTSD Consultations:  As needed Discharge Concerns:  safety Estimated LOS: Other:      Jackelyn Poling, NP 11/29/202012:20 AM

## 2019-04-15 NOTE — Plan of Care (Signed)
[facility] Crisis Plan  Reason for Crisis Plan:  Crisis Stabilization   Plan of Care:  Referral for Inpatient Hospitalization  Family Support:      Current Living Environment:  Living Arrangements: Other (Comment)(Staying with ex-wife)  Insurance:   Hospital Account    Name Acct ID Class Status Primary Coverage   Orhan, Mayorga [id number] [place]        Guarantor Account (for Hospital Account [id number])    Name Relation to Pt Service Area Active? Acct Type   [place], [place]   Address Phone       [place],  [address] [phone])          Coverage Information (for Hospital Account [id number])    F/O Payor/Plan Precert #   [employer]    Subscriber Subscriber #   [place], [place] [id number]   Address Phone   [address] [place], [place] [address] [phone]      Legal Guardian:  Legal Guardian: Other:(Self)  Primary Care Provider:  [facility]  Current Outpatient Providers:  [facility]  Psychiatrist:  Name of Psychiatrist: [facility]  Counselor/Therapist:  Name of Therapist: None  Compliant with Medications:  Yes  Additional Information:   Jesusita Oka 11/29/20202:25 AM

## 2019-04-15 NOTE — Progress Notes (Signed)
Patient currently out of bed and sitting in the dayroom. Reporting that he would like a prescription for clonopin because he will not be able to meet with his [place] provider before 90 days. Patient is focused on prescriptions, frequently asking this Probation officer to contact the psychiatrist.

## 2019-05-09 ENCOUNTER — Ambulatory Visit
Admission: RE | Admit: 2019-05-09 | Discharge: 2019-05-09 | Disposition: A | Discharge status: Home / Self Care | Payer: No Typology Code available for payment source | Source: Ambulatory Visit | Attending: *Deleted | Admitting: *Deleted

## 2019-05-09 ENCOUNTER — Other Ambulatory Visit: Payer: Self-pay | Admitting: *Deleted

## 2019-05-09 DIAGNOSIS — Z9289 Personal history of other medical treatment: Secondary | ICD-10-CM

## 2020-05-09 IMAGING — CR DG CHEST 2V
2 series · 2 of 2 positions shown · non-contrast
Comparison: December 21, 2017 and June 05, 2018

CLINICAL DATA: Sree operculum skin test

EXAM:
CHEST - 2 VIEW

[w chest pa]
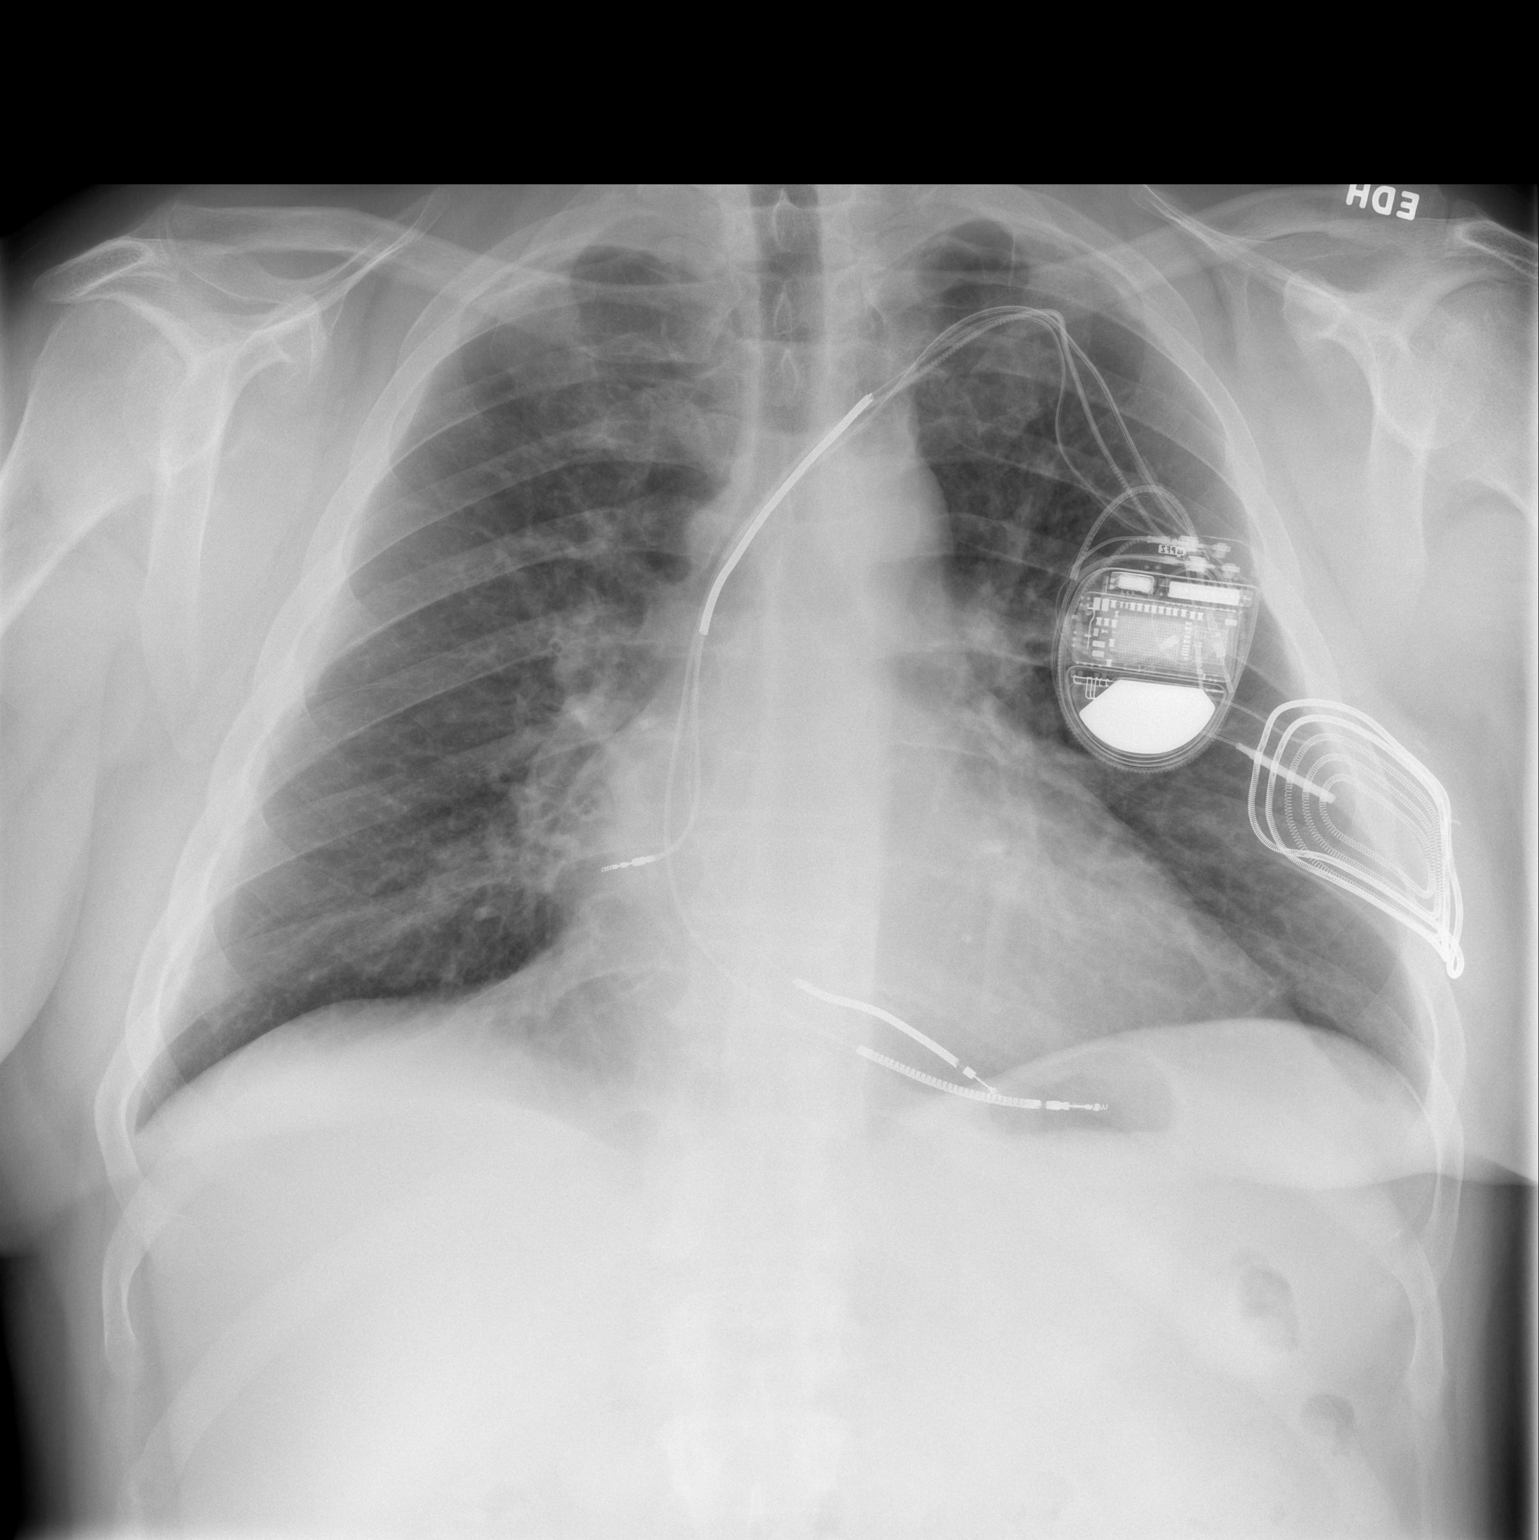

[w chest lat]
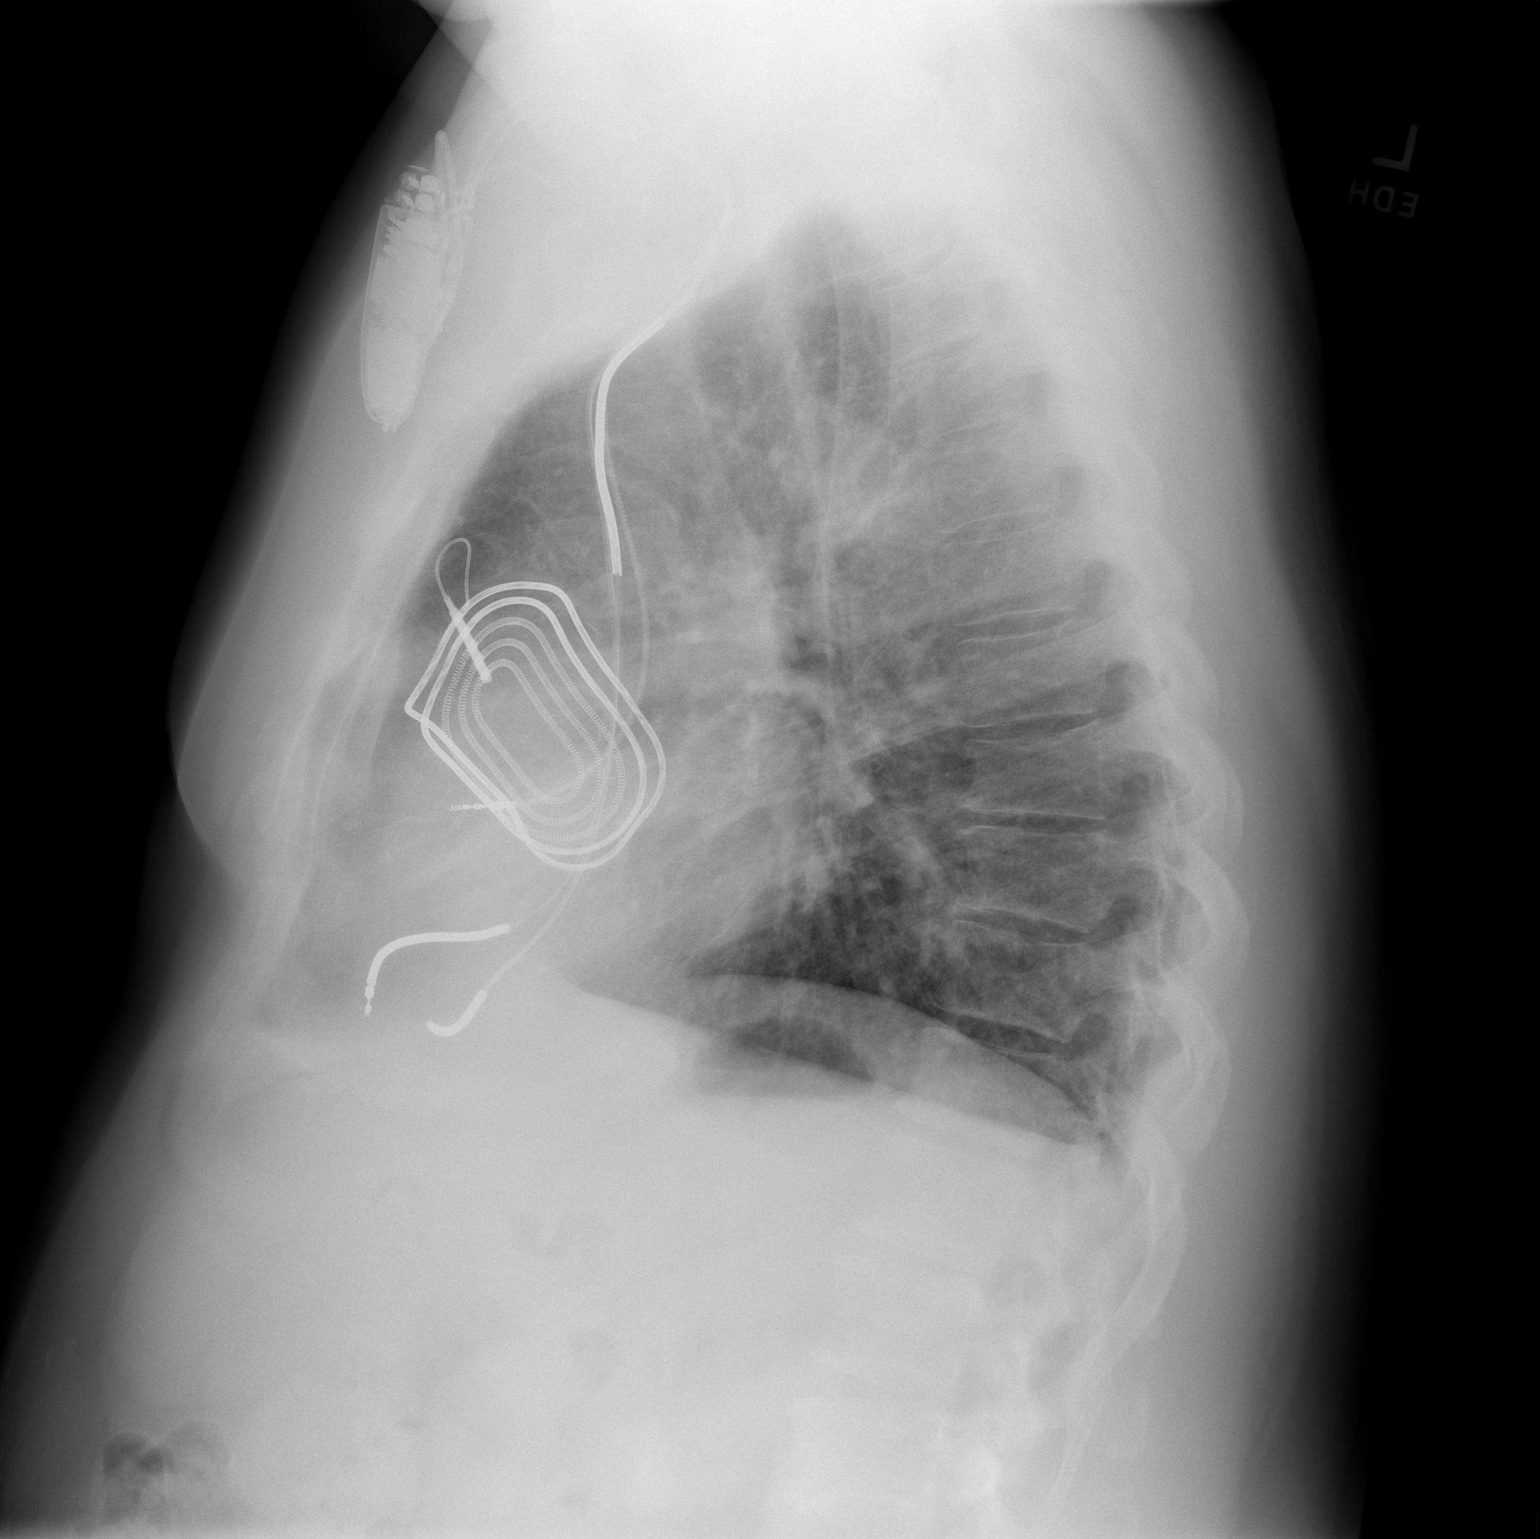

[2 of 2 positions shown; findings below may reference images not displayed]

FINDINGS: There is no edema or consolidation. Heart is upper normal in size
with pulmonary vascular normal. Pacemaker leads are attached to the
right atrium and right ventricle. No adenopathy. No bone lesions.
IMPRESSION: Lungs clear. No adenopathy. Stable cardiac silhouette with pacemaker
leads as described.

## 2022-01-15 DEATH — deceased
